# Patient Record
Sex: Female | Born: 1960
Health system: Southern US, Community
[De-identification: ages and names within clinical notes are randomized; demographics above are authoritative.]

## PROBLEM LIST (undated history)

## (undated) DIAGNOSIS — C801 Malignant (primary) neoplasm, unspecified: Secondary | ICD-10-CM

## (undated) DIAGNOSIS — C50919 Malignant neoplasm of unspecified site of unspecified female breast: Secondary | ICD-10-CM

## (undated) HISTORY — PX: BREAST LUMPECTOMY: SHX2

---

## 2004-07-09 ENCOUNTER — Ambulatory Visit: Payer: Self-pay

## 2004-08-07 ENCOUNTER — Ambulatory Visit: Payer: Self-pay

## 2005-02-01 ENCOUNTER — Ambulatory Visit: Payer: Self-pay

## 2005-09-10 ENCOUNTER — Ambulatory Visit: Payer: Self-pay | Admitting: Unknown Physician Specialty

## 2005-10-21 ENCOUNTER — Ambulatory Visit: Payer: Self-pay | Admitting: Obstetrics and Gynecology

## 2006-06-14 DIAGNOSIS — C50919 Malignant neoplasm of unspecified site of unspecified female breast: Secondary | ICD-10-CM

## 2006-06-14 DIAGNOSIS — C801 Malignant (primary) neoplasm, unspecified: Secondary | ICD-10-CM

## 2006-06-14 HISTORY — PX: BREAST BIOPSY: SHX20

## 2006-06-14 HISTORY — DX: Malignant neoplasm of unspecified site of unspecified female breast: C50.919

## 2006-06-14 HISTORY — DX: Malignant (primary) neoplasm, unspecified: C80.1

## 2006-11-08 ENCOUNTER — Ambulatory Visit: Payer: Self-pay | Admitting: Obstetrics and Gynecology

## 2006-11-17 ENCOUNTER — Ambulatory Visit: Payer: Self-pay | Admitting: General Surgery

## 2006-11-25 ENCOUNTER — Ambulatory Visit: Payer: Self-pay | Admitting: General Surgery

## 2006-11-29 ENCOUNTER — Ambulatory Visit: Payer: Self-pay | Admitting: General Surgery

## 2006-12-13 ENCOUNTER — Ambulatory Visit: Payer: Self-pay | Admitting: Internal Medicine

## 2006-12-22 ENCOUNTER — Ambulatory Visit: Payer: Self-pay | Admitting: Internal Medicine

## 2007-01-13 ENCOUNTER — Ambulatory Visit: Payer: Self-pay | Admitting: Internal Medicine

## 2007-02-13 ENCOUNTER — Ambulatory Visit: Payer: Self-pay | Admitting: Internal Medicine

## 2007-03-15 ENCOUNTER — Ambulatory Visit: Payer: Self-pay | Admitting: Internal Medicine

## 2007-04-15 ENCOUNTER — Ambulatory Visit: Payer: Self-pay | Admitting: Internal Medicine

## 2007-05-12 ENCOUNTER — Ambulatory Visit: Payer: Self-pay | Admitting: Obstetrics and Gynecology

## 2007-05-19 ENCOUNTER — Ambulatory Visit: Payer: Self-pay | Admitting: General Surgery

## 2007-06-15 ENCOUNTER — Ambulatory Visit: Payer: Self-pay | Admitting: Internal Medicine

## 2007-07-06 ENCOUNTER — Ambulatory Visit: Payer: Self-pay | Admitting: Internal Medicine

## 2007-07-16 ENCOUNTER — Ambulatory Visit: Payer: Self-pay | Admitting: Internal Medicine

## 2007-07-21 ENCOUNTER — Encounter: Admission: RE | Admit: 2007-07-21 | Discharge: 2007-07-21 | Payer: Self-pay | Admitting: Internal Medicine

## 2007-08-13 ENCOUNTER — Ambulatory Visit: Payer: Self-pay | Admitting: Internal Medicine

## 2007-08-31 ENCOUNTER — Ambulatory Visit: Payer: Self-pay | Admitting: Radiation Oncology

## 2007-09-13 ENCOUNTER — Ambulatory Visit: Payer: Self-pay | Admitting: Internal Medicine

## 2007-10-05 ENCOUNTER — Ambulatory Visit: Payer: Self-pay | Admitting: Internal Medicine

## 2007-10-13 ENCOUNTER — Ambulatory Visit: Payer: Self-pay | Admitting: Internal Medicine

## 2007-11-13 ENCOUNTER — Ambulatory Visit: Payer: Self-pay | Admitting: Internal Medicine

## 2007-11-13 ENCOUNTER — Ambulatory Visit: Payer: Self-pay | Admitting: General Surgery

## 2008-01-13 ENCOUNTER — Ambulatory Visit: Payer: Self-pay | Admitting: Internal Medicine

## 2008-02-02 ENCOUNTER — Ambulatory Visit: Payer: Self-pay | Admitting: Internal Medicine

## 2008-02-13 ENCOUNTER — Ambulatory Visit: Payer: Self-pay | Admitting: Internal Medicine

## 2008-02-13 ENCOUNTER — Ambulatory Visit: Payer: Self-pay | Admitting: Radiation Oncology

## 2008-03-14 ENCOUNTER — Ambulatory Visit: Payer: Self-pay | Admitting: Radiation Oncology

## 2008-07-15 ENCOUNTER — Ambulatory Visit: Payer: Self-pay | Admitting: Internal Medicine

## 2008-08-22 ENCOUNTER — Ambulatory Visit: Payer: Self-pay | Admitting: Internal Medicine

## 2008-09-02 ENCOUNTER — Encounter: Admission: RE | Admit: 2008-09-02 | Discharge: 2008-09-02 | Payer: Self-pay | Admitting: Internal Medicine

## 2008-09-12 ENCOUNTER — Ambulatory Visit: Payer: Self-pay | Admitting: Internal Medicine

## 2008-12-12 ENCOUNTER — Ambulatory Visit: Payer: Self-pay | Admitting: Internal Medicine

## 2008-12-26 ENCOUNTER — Ambulatory Visit: Payer: Self-pay | Admitting: Internal Medicine

## 2009-01-12 ENCOUNTER — Ambulatory Visit: Payer: Self-pay | Admitting: Internal Medicine

## 2009-06-10 ENCOUNTER — Ambulatory Visit: Payer: Self-pay | Admitting: Internal Medicine

## 2009-06-14 ENCOUNTER — Ambulatory Visit: Payer: Self-pay | Admitting: Internal Medicine

## 2009-07-03 ENCOUNTER — Ambulatory Visit: Payer: Self-pay | Admitting: Internal Medicine

## 2009-07-15 ENCOUNTER — Ambulatory Visit: Payer: Self-pay | Admitting: Internal Medicine

## 2009-09-12 ENCOUNTER — Ambulatory Visit: Payer: Self-pay | Admitting: Radiation Oncology

## 2009-09-25 ENCOUNTER — Ambulatory Visit: Payer: Self-pay | Admitting: Radiation Oncology

## 2009-09-25 ENCOUNTER — Ambulatory Visit: Payer: Self-pay | Admitting: Internal Medicine

## 2009-09-25 ENCOUNTER — Ambulatory Visit: Payer: Self-pay | Admitting: Obstetrics and Gynecology

## 2009-10-12 ENCOUNTER — Ambulatory Visit: Payer: Self-pay | Admitting: Radiation Oncology

## 2009-10-30 ENCOUNTER — Encounter: Admission: RE | Admit: 2009-10-30 | Discharge: 2009-10-30 | Payer: Self-pay | Admitting: Internal Medicine

## 2010-07-30 ENCOUNTER — Ambulatory Visit: Payer: Self-pay | Admitting: Internal Medicine

## 2010-07-31 LAB — CANCER ANTIGEN 27.29: CA 27.29: 23.4 U/mL (ref 0.0–38.6)

## 2010-08-13 ENCOUNTER — Ambulatory Visit: Payer: Self-pay | Admitting: Internal Medicine

## 2010-09-28 ENCOUNTER — Ambulatory Visit: Payer: Self-pay | Admitting: Internal Medicine

## 2011-01-25 ENCOUNTER — Ambulatory Visit: Payer: Self-pay | Admitting: Internal Medicine

## 2011-02-05 ENCOUNTER — Ambulatory Visit: Payer: Self-pay | Admitting: Unknown Physician Specialty

## 2011-02-13 ENCOUNTER — Ambulatory Visit: Payer: Self-pay | Admitting: Internal Medicine

## 2011-03-15 ENCOUNTER — Ambulatory Visit: Payer: Self-pay | Admitting: Internal Medicine

## 2011-04-15 ENCOUNTER — Ambulatory Visit: Payer: Self-pay | Admitting: Internal Medicine

## 2011-12-30 ENCOUNTER — Ambulatory Visit: Payer: Self-pay | Admitting: Obstetrics and Gynecology

## 2013-05-24 ENCOUNTER — Ambulatory Visit: Payer: Self-pay | Admitting: Obstetrics and Gynecology

## 2013-12-07 ENCOUNTER — Ambulatory Visit: Payer: Self-pay | Admitting: Obstetrics and Gynecology

## 2013-12-20 DIAGNOSIS — E039 Hypothyroidism, unspecified: Secondary | ICD-10-CM | POA: Insufficient documentation

## 2013-12-20 DIAGNOSIS — G4700 Insomnia, unspecified: Secondary | ICD-10-CM | POA: Insufficient documentation

## 2013-12-20 DIAGNOSIS — E1169 Type 2 diabetes mellitus with other specified complication: Secondary | ICD-10-CM | POA: Insufficient documentation

## 2015-02-13 DIAGNOSIS — R768 Other specified abnormal immunological findings in serum: Secondary | ICD-10-CM | POA: Insufficient documentation

## 2015-06-26 ENCOUNTER — Other Ambulatory Visit: Payer: Self-pay | Admitting: Obstetrics and Gynecology

## 2015-06-26 DIAGNOSIS — Z1231 Encounter for screening mammogram for malignant neoplasm of breast: Secondary | ICD-10-CM

## 2015-07-17 ENCOUNTER — Ambulatory Visit
Admission: RE | Admit: 2015-07-17 | Discharge: 2015-07-17 | Disposition: A | Discharge status: Home / Self Care | Payer: 59 | Source: Ambulatory Visit | Attending: Obstetrics and Gynecology | Admitting: Obstetrics and Gynecology

## 2015-07-17 DIAGNOSIS — Z1231 Encounter for screening mammogram for malignant neoplasm of breast: Secondary | ICD-10-CM | POA: Diagnosis not present

## 2015-07-17 HISTORY — DX: Malignant (primary) neoplasm, unspecified: C80.1

## 2015-07-17 HISTORY — DX: Malignant neoplasm of unspecified site of unspecified female breast: C50.919

## 2016-06-15 DIAGNOSIS — R7309 Other abnormal glucose: Secondary | ICD-10-CM | POA: Diagnosis not present

## 2016-06-15 DIAGNOSIS — E7800 Pure hypercholesterolemia, unspecified: Secondary | ICD-10-CM | POA: Diagnosis not present

## 2016-06-15 DIAGNOSIS — Z0000 Encounter for general adult medical examination without abnormal findings: Secondary | ICD-10-CM | POA: Diagnosis not present

## 2016-06-15 DIAGNOSIS — E559 Vitamin D deficiency, unspecified: Secondary | ICD-10-CM | POA: Diagnosis not present

## 2016-07-06 DIAGNOSIS — R05 Cough: Secondary | ICD-10-CM | POA: Diagnosis not present

## 2016-07-22 DIAGNOSIS — Z01419 Encounter for gynecological examination (general) (routine) without abnormal findings: Secondary | ICD-10-CM | POA: Diagnosis not present

## 2016-07-22 DIAGNOSIS — Z124 Encounter for screening for malignant neoplasm of cervix: Secondary | ICD-10-CM | POA: Diagnosis not present

## 2016-07-23 ENCOUNTER — Other Ambulatory Visit: Payer: Self-pay | Admitting: Obstetrics and Gynecology

## 2016-07-23 DIAGNOSIS — Z1231 Encounter for screening mammogram for malignant neoplasm of breast: Secondary | ICD-10-CM

## 2016-08-12 DIAGNOSIS — L738 Other specified follicular disorders: Secondary | ICD-10-CM | POA: Diagnosis not present

## 2016-08-30 ENCOUNTER — Ambulatory Visit
Admission: RE | Admit: 2016-08-30 | Discharge: 2016-08-30 | Disposition: A | Discharge status: Home / Self Care | Payer: 59 | Source: Ambulatory Visit | Attending: Obstetrics and Gynecology | Admitting: Obstetrics and Gynecology

## 2016-08-30 DIAGNOSIS — Z1231 Encounter for screening mammogram for malignant neoplasm of breast: Secondary | ICD-10-CM

## 2016-10-14 DIAGNOSIS — L738 Other specified follicular disorders: Secondary | ICD-10-CM | POA: Diagnosis not present

## 2016-11-18 DIAGNOSIS — L280 Lichen simplex chronicus: Secondary | ICD-10-CM | POA: Diagnosis not present

## 2016-11-18 DIAGNOSIS — L738 Other specified follicular disorders: Secondary | ICD-10-CM | POA: Diagnosis not present

## 2016-12-16 DIAGNOSIS — L280 Lichen simplex chronicus: Secondary | ICD-10-CM | POA: Diagnosis not present

## 2016-12-16 DIAGNOSIS — L738 Other specified follicular disorders: Secondary | ICD-10-CM | POA: Diagnosis not present

## 2017-02-01 DIAGNOSIS — I152 Hypertension secondary to endocrine disorders: Secondary | ICD-10-CM | POA: Insufficient documentation

## 2017-02-01 DIAGNOSIS — E039 Hypothyroidism, unspecified: Secondary | ICD-10-CM | POA: Diagnosis not present

## 2017-02-01 DIAGNOSIS — Z79899 Other long term (current) drug therapy: Secondary | ICD-10-CM | POA: Diagnosis not present

## 2017-02-01 DIAGNOSIS — E119 Type 2 diabetes mellitus without complications: Secondary | ICD-10-CM | POA: Insufficient documentation

## 2017-02-01 DIAGNOSIS — R7302 Impaired glucose tolerance (oral): Secondary | ICD-10-CM | POA: Diagnosis not present

## 2017-02-01 DIAGNOSIS — E559 Vitamin D deficiency, unspecified: Secondary | ICD-10-CM | POA: Diagnosis not present

## 2017-02-01 DIAGNOSIS — I10 Essential (primary) hypertension: Secondary | ICD-10-CM | POA: Diagnosis not present

## 2017-02-10 DIAGNOSIS — L738 Other specified follicular disorders: Secondary | ICD-10-CM | POA: Diagnosis not present

## 2017-02-10 DIAGNOSIS — L280 Lichen simplex chronicus: Secondary | ICD-10-CM | POA: Diagnosis not present

## 2017-03-21 DIAGNOSIS — L905 Scar conditions and fibrosis of skin: Secondary | ICD-10-CM | POA: Diagnosis not present

## 2017-03-21 DIAGNOSIS — L280 Lichen simplex chronicus: Secondary | ICD-10-CM | POA: Diagnosis not present

## 2017-04-26 DIAGNOSIS — I10 Essential (primary) hypertension: Secondary | ICD-10-CM | POA: Diagnosis not present

## 2017-07-28 DIAGNOSIS — Z124 Encounter for screening for malignant neoplasm of cervix: Secondary | ICD-10-CM | POA: Diagnosis not present

## 2017-07-28 DIAGNOSIS — Z01419 Encounter for gynecological examination (general) (routine) without abnormal findings: Secondary | ICD-10-CM | POA: Diagnosis not present

## 2017-07-28 DIAGNOSIS — R635 Abnormal weight gain: Secondary | ICD-10-CM | POA: Diagnosis not present

## 2017-08-02 ENCOUNTER — Other Ambulatory Visit: Payer: Self-pay | Admitting: Obstetrics and Gynecology

## 2017-08-02 DIAGNOSIS — Z1231 Encounter for screening mammogram for malignant neoplasm of breast: Secondary | ICD-10-CM

## 2017-08-02 DIAGNOSIS — Z13820 Encounter for screening for osteoporosis: Secondary | ICD-10-CM

## 2017-08-25 ENCOUNTER — Ambulatory Visit
Admission: RE | Admit: 2017-08-25 | Discharge: 2017-08-25 | Disposition: A | Discharge status: Home / Self Care | Payer: 59 | Source: Ambulatory Visit | Attending: Internal Medicine | Admitting: Internal Medicine

## 2017-08-25 ENCOUNTER — Other Ambulatory Visit: Payer: Self-pay | Admitting: Internal Medicine

## 2017-08-25 DIAGNOSIS — E7800 Pure hypercholesterolemia, unspecified: Secondary | ICD-10-CM | POA: Diagnosis not present

## 2017-08-25 DIAGNOSIS — D869 Sarcoidosis, unspecified: Secondary | ICD-10-CM | POA: Diagnosis not present

## 2017-08-25 DIAGNOSIS — I517 Cardiomegaly: Secondary | ICD-10-CM | POA: Diagnosis not present

## 2017-08-25 DIAGNOSIS — R7302 Impaired glucose tolerance (oral): Secondary | ICD-10-CM | POA: Diagnosis not present

## 2017-08-25 DIAGNOSIS — R059 Cough, unspecified: Secondary | ICD-10-CM

## 2017-08-25 DIAGNOSIS — R05 Cough: Secondary | ICD-10-CM

## 2017-08-25 DIAGNOSIS — Z0000 Encounter for general adult medical examination without abnormal findings: Secondary | ICD-10-CM | POA: Diagnosis not present

## 2017-08-25 DIAGNOSIS — E559 Vitamin D deficiency, unspecified: Secondary | ICD-10-CM | POA: Diagnosis not present

## 2017-09-15 ENCOUNTER — Ambulatory Visit
Admission: RE | Admit: 2017-09-15 | Discharge: 2017-09-15 | Disposition: A | Discharge status: Home / Self Care | Payer: 59 | Source: Ambulatory Visit | Attending: Obstetrics and Gynecology | Admitting: Obstetrics and Gynecology

## 2017-09-15 DIAGNOSIS — Z1231 Encounter for screening mammogram for malignant neoplasm of breast: Secondary | ICD-10-CM | POA: Diagnosis not present

## 2017-09-15 DIAGNOSIS — Z780 Asymptomatic menopausal state: Secondary | ICD-10-CM | POA: Diagnosis not present

## 2017-09-15 DIAGNOSIS — M85822 Other specified disorders of bone density and structure, left upper arm: Secondary | ICD-10-CM | POA: Diagnosis not present

## 2017-09-15 DIAGNOSIS — Z13820 Encounter for screening for osteoporosis: Secondary | ICD-10-CM

## 2017-09-15 DIAGNOSIS — L905 Scar conditions and fibrosis of skin: Secondary | ICD-10-CM | POA: Diagnosis not present

## 2017-09-22 DIAGNOSIS — I8312 Varicose veins of left lower extremity with inflammation: Secondary | ICD-10-CM | POA: Diagnosis not present

## 2017-09-22 DIAGNOSIS — I83813 Varicose veins of bilateral lower extremities with pain: Secondary | ICD-10-CM | POA: Diagnosis not present

## 2017-10-10 DIAGNOSIS — I83813 Varicose veins of bilateral lower extremities with pain: Secondary | ICD-10-CM | POA: Diagnosis not present

## 2017-11-10 DIAGNOSIS — I8312 Varicose veins of left lower extremity with inflammation: Secondary | ICD-10-CM | POA: Diagnosis not present

## 2017-11-10 DIAGNOSIS — I83812 Varicose veins of left lower extremities with pain: Secondary | ICD-10-CM | POA: Diagnosis not present

## 2017-12-22 DIAGNOSIS — I83812 Varicose veins of left lower extremities with pain: Secondary | ICD-10-CM | POA: Diagnosis not present

## 2017-12-22 DIAGNOSIS — R21 Rash and other nonspecific skin eruption: Secondary | ICD-10-CM | POA: Diagnosis not present

## 2017-12-22 DIAGNOSIS — I8312 Varicose veins of left lower extremity with inflammation: Secondary | ICD-10-CM | POA: Diagnosis not present

## 2017-12-22 DIAGNOSIS — L738 Other specified follicular disorders: Secondary | ICD-10-CM | POA: Diagnosis not present

## 2017-12-26 DIAGNOSIS — I8312 Varicose veins of left lower extremity with inflammation: Secondary | ICD-10-CM | POA: Diagnosis not present

## 2017-12-28 DIAGNOSIS — E039 Hypothyroidism, unspecified: Secondary | ICD-10-CM | POA: Diagnosis not present

## 2017-12-28 DIAGNOSIS — N951 Menopausal and female climacteric states: Secondary | ICD-10-CM | POA: Diagnosis not present

## 2017-12-28 DIAGNOSIS — Z79890 Hormone replacement therapy (postmenopausal): Secondary | ICD-10-CM | POA: Diagnosis not present

## 2018-01-19 DIAGNOSIS — I83892 Varicose veins of left lower extremities with other complications: Secondary | ICD-10-CM | POA: Diagnosis not present

## 2018-01-19 DIAGNOSIS — I8312 Varicose veins of left lower extremity with inflammation: Secondary | ICD-10-CM | POA: Diagnosis not present

## 2018-01-26 DIAGNOSIS — M810 Age-related osteoporosis without current pathological fracture: Secondary | ICD-10-CM | POA: Diagnosis not present

## 2018-01-26 DIAGNOSIS — Z79890 Hormone replacement therapy (postmenopausal): Secondary | ICD-10-CM | POA: Diagnosis not present

## 2018-01-26 DIAGNOSIS — N951 Menopausal and female climacteric states: Secondary | ICD-10-CM | POA: Diagnosis not present

## 2018-02-02 DIAGNOSIS — S99922A Unspecified injury of left foot, initial encounter: Secondary | ICD-10-CM | POA: Diagnosis not present

## 2018-02-02 DIAGNOSIS — I8312 Varicose veins of left lower extremity with inflammation: Secondary | ICD-10-CM | POA: Diagnosis not present

## 2018-02-02 DIAGNOSIS — M7981 Nontraumatic hematoma of soft tissue: Secondary | ICD-10-CM | POA: Diagnosis not present

## 2018-02-02 DIAGNOSIS — I83812 Varicose veins of left lower extremities with pain: Secondary | ICD-10-CM | POA: Diagnosis not present

## 2018-02-16 DIAGNOSIS — I8312 Varicose veins of left lower extremity with inflammation: Secondary | ICD-10-CM | POA: Diagnosis not present

## 2018-02-16 DIAGNOSIS — M7981 Nontraumatic hematoma of soft tissue: Secondary | ICD-10-CM | POA: Diagnosis not present

## 2018-02-17 DIAGNOSIS — J069 Acute upper respiratory infection, unspecified: Secondary | ICD-10-CM | POA: Diagnosis not present

## 2018-02-17 DIAGNOSIS — J019 Acute sinusitis, unspecified: Secondary | ICD-10-CM | POA: Diagnosis not present

## 2018-03-13 DIAGNOSIS — N951 Menopausal and female climacteric states: Secondary | ICD-10-CM | POA: Diagnosis not present

## 2018-03-13 DIAGNOSIS — E039 Hypothyroidism, unspecified: Secondary | ICD-10-CM | POA: Diagnosis not present

## 2018-03-13 DIAGNOSIS — E559 Vitamin D deficiency, unspecified: Secondary | ICD-10-CM | POA: Diagnosis not present

## 2018-03-13 DIAGNOSIS — D869 Sarcoidosis, unspecified: Secondary | ICD-10-CM | POA: Diagnosis not present

## 2018-03-13 DIAGNOSIS — R739 Hyperglycemia, unspecified: Secondary | ICD-10-CM | POA: Diagnosis not present

## 2018-03-13 DIAGNOSIS — E782 Mixed hyperlipidemia: Secondary | ICD-10-CM | POA: Diagnosis not present

## 2018-03-13 DIAGNOSIS — Z79890 Hormone replacement therapy (postmenopausal): Secondary | ICD-10-CM | POA: Diagnosis not present

## 2018-03-13 DIAGNOSIS — D649 Anemia, unspecified: Secondary | ICD-10-CM | POA: Diagnosis not present

## 2018-03-14 DIAGNOSIS — I8312 Varicose veins of left lower extremity with inflammation: Secondary | ICD-10-CM | POA: Diagnosis not present

## 2018-03-14 DIAGNOSIS — M7981 Nontraumatic hematoma of soft tissue: Secondary | ICD-10-CM | POA: Diagnosis not present

## 2018-03-21 DIAGNOSIS — H109 Unspecified conjunctivitis: Secondary | ICD-10-CM | POA: Diagnosis not present

## 2018-03-28 DIAGNOSIS — I8312 Varicose veins of left lower extremity with inflammation: Secondary | ICD-10-CM | POA: Diagnosis not present

## 2018-04-03 DIAGNOSIS — H15122 Nodular episcleritis, left eye: Secondary | ICD-10-CM | POA: Diagnosis not present

## 2018-04-10 DIAGNOSIS — H15122 Nodular episcleritis, left eye: Secondary | ICD-10-CM | POA: Diagnosis not present

## 2018-04-10 DIAGNOSIS — D8689 Sarcoidosis of other sites: Secondary | ICD-10-CM | POA: Diagnosis not present

## 2018-04-17 DIAGNOSIS — I8312 Varicose veins of left lower extremity with inflammation: Secondary | ICD-10-CM | POA: Diagnosis not present

## 2018-04-20 DIAGNOSIS — L738 Other specified follicular disorders: Secondary | ICD-10-CM | POA: Diagnosis not present

## 2018-05-23 DIAGNOSIS — I8312 Varicose veins of left lower extremity with inflammation: Secondary | ICD-10-CM | POA: Diagnosis not present

## 2018-06-22 DIAGNOSIS — Z79890 Hormone replacement therapy (postmenopausal): Secondary | ICD-10-CM | POA: Diagnosis not present

## 2018-06-22 DIAGNOSIS — M810 Age-related osteoporosis without current pathological fracture: Secondary | ICD-10-CM | POA: Diagnosis not present

## 2018-06-22 DIAGNOSIS — E039 Hypothyroidism, unspecified: Secondary | ICD-10-CM | POA: Diagnosis not present

## 2018-08-15 ENCOUNTER — Other Ambulatory Visit: Payer: Self-pay | Admitting: Internal Medicine

## 2018-08-15 DIAGNOSIS — Z1231 Encounter for screening mammogram for malignant neoplasm of breast: Secondary | ICD-10-CM

## 2018-09-01 DIAGNOSIS — L280 Lichen simplex chronicus: Secondary | ICD-10-CM | POA: Diagnosis not present

## 2018-09-01 DIAGNOSIS — L738 Other specified follicular disorders: Secondary | ICD-10-CM | POA: Diagnosis not present

## 2018-09-06 DIAGNOSIS — R5383 Other fatigue: Secondary | ICD-10-CM | POA: Diagnosis not present

## 2018-09-06 DIAGNOSIS — E039 Hypothyroidism, unspecified: Secondary | ICD-10-CM | POA: Diagnosis not present

## 2018-09-06 DIAGNOSIS — N951 Menopausal and female climacteric states: Secondary | ICD-10-CM | POA: Diagnosis not present

## 2018-09-06 DIAGNOSIS — Z79890 Hormone replacement therapy (postmenopausal): Secondary | ICD-10-CM | POA: Diagnosis not present

## 2019-01-08 ENCOUNTER — Ambulatory Visit
Admission: RE | Admit: 2019-01-08 | Discharge: 2019-01-08 | Disposition: A | Discharge status: Home / Self Care | Payer: 59 | Source: Ambulatory Visit | Attending: Internal Medicine | Admitting: Internal Medicine

## 2019-01-08 DIAGNOSIS — Z1231 Encounter for screening mammogram for malignant neoplasm of breast: Secondary | ICD-10-CM | POA: Diagnosis present

## 2019-03-08 DIAGNOSIS — D863 Sarcoidosis of skin: Secondary | ICD-10-CM

## 2019-03-08 HISTORY — DX: Sarcoidosis of skin: D86.3

## 2019-09-27 ENCOUNTER — Encounter: Payer: Self-pay | Admitting: Dermatology

## 2019-09-27 ENCOUNTER — Ambulatory Visit: Payer: 59 | Admitting: Dermatology

## 2019-09-27 ENCOUNTER — Other Ambulatory Visit: Payer: Self-pay

## 2019-09-27 DIAGNOSIS — D485 Neoplasm of uncertain behavior of skin: Secondary | ICD-10-CM | POA: Diagnosis not present

## 2019-09-27 NOTE — Progress Notes (Signed)
Follow-Up Visit   Subjective  Natalie Higgins is a 59 y.o. female who presents for the following: Follow-up. Patient has a history, on and off, for 5 years of sores, itching, and hairloss of the scalp. This has worsened over the past 3 weeks. Currently she is using cortisone cream, Neosporin, Betadine, and Aloe Vera oil. She has used multiple topical steroids and had IL steroid injections in the past without any improvement. Patient has a history of sarcoidosis that is still active on the skin;, biopsy proven in September 2020, of the back.  In the past, her scalp condition was not nearly as bad, and was felt possibly due to sarcoidosis or psoriasis.  Previous treatments were helpful, but apparently this has progressed more recently.  The following portions of the chart were reviewed this encounter and updated as appropriate: Allergies  Meds  Problems  Med Hx  Surg Hx  Fam Hx      Review of Systems: No other skin or systemic complaints.  Objective  Well appearing patient in no apparent distress; mood and affect are within normal limits.  A focused examination was performed including face, scalp. Relevant physical exam findings are noted in the Assessment and Plan.  Objective  L Posterior scalp lateral: Large scalp plaques with alopecia of post and sides of scalp.  Objective  Left Posterior Scalp Medial: Large scalp plaques with alopecia of post and sides of scalp.         Assessment & Plan  Neoplasm of uncertain behavior of skin (2) L Posterior scalp lateral  Skin / nail biopsy Type of biopsy: punch   Informed consent: discussed and consent obtained   Timeout: patient name, date of birth, surgical site, and procedure verified   Procedure prep:  Patient was prepped and draped in usual sterile fashion (the patient was cleaned and prepped) Prep type:  Isopropyl alcohol Anesthesia: the lesion was anesthetized in a standard fashion   Anesthetic:  1% lidocaine w/  epinephrine 1-100,000 buffered w/ 8.4% NaHCO3 Punch size:  3.5 mm Suture size:  4-0 Suture type: nylon   Hemostasis achieved with: suture, pressure and aluminum chloride   Outcome: patient tolerated procedure well   Post-procedure details: sterile dressing applied and wound care instructions given   Dressing type: bandage, petrolatum and pressure dressing    Specimen 1 - Surgical pathology Differential Diagnosis: Psoriasis vs Sarcoidosis vs Tinea vs Other Alopecia Check Margins: No Objective: Large scalp plaques with alopecia of post and sides of scalp.  Left Posterior Scalp Medial  Skin / nail biopsy Type of biopsy: punch   Informed consent: discussed and consent obtained   Timeout: patient name, date of birth, surgical site, and procedure verified   Procedure prep:  Patient was prepped and draped in usual sterile fashion (the patient was cleaned and prepped) Prep type:  Isopropyl alcohol Anesthesia: the lesion was anesthetized in a standard fashion   Anesthetic:  1% lidocaine w/ epinephrine 1-100,000 buffered w/ 8.4% NaHCO3 Punch size:  3.5 mm Suture size:  4-0 Suture type: nylon   Hemostasis achieved with: suture, pressure and aluminum chloride   Outcome: patient tolerated procedure well   Post-procedure details: sterile dressing applied and wound care instructions given   Dressing type: bandage, petrolatum and pressure dressing    Specimen 2 - Surgical pathology Differential Diagnosis: Psoriasis vs Sarcoidosis vs Tinea vs Other Alopecia Check Margins: No Objective: Large scalp plaques with alopecia of post and sides of scalp.   Will discuss biopsy results  and treatment options on follow-up.  Return in about 1 week (around 10/04/2019) for Suture removal, biopsy f/u.Lindi Adie, CMA, am acting as scribe for Sarina Ser, MD . Documentation: I have reviewed the above documentation for accuracy and completeness, and I agree with the above.  Sarina Ser,  MD

## 2019-09-27 NOTE — Patient Instructions (Signed)

## 2019-10-04 ENCOUNTER — Other Ambulatory Visit: Payer: Self-pay

## 2019-10-04 ENCOUNTER — Ambulatory Visit: Payer: 59 | Admitting: Dermatology

## 2019-10-04 DIAGNOSIS — D863 Sarcoidosis of skin: Secondary | ICD-10-CM

## 2019-10-04 DIAGNOSIS — Z8709 Personal history of other diseases of the respiratory system: Secondary | ICD-10-CM

## 2019-10-04 DIAGNOSIS — D869 Sarcoidosis, unspecified: Secondary | ICD-10-CM

## 2019-10-04 NOTE — Progress Notes (Signed)
   Follow-Up Visit   Subjective  Natalie Higgins is a 59 y.o. female who presents for the following: suture removal and discuss pathology results/treatment (patient states that suture sites have been itchy but otherwise healing ok). Patient c/o pain, itching, and hair loss that is significantly affecting her life.  The following portions of the chart were reviewed this encounter and updated as appropriate: Allergies  Meds  Problems  Med Hx  Surg Hx  Fam Hx     Review of Systems: No other skin or systemic complaints.  Objective  Well appearing patient in no apparent distress; mood and affect are within normal limits.  A focused examination was performed including the scalp. Relevant physical exam findings are noted in the Assessment and Plan.  Objective  Scalp: Healing biopsy site   Assessment & Plan  Sarcoid Scalp  Biopsy proven Sarcoidosis of skin of scalp (with a past history of lung involvement). Symptoms include pain, itching, and hair loss of the scalp - sutures removed and Vaseline applied to aa's. Discussed pathology results and treatment options. Consider Plaquenil 200mg  orally; discussed possible side effects of eye changes. Patient recently evaluated by her ophthalmologist in March. No pulmonary issues per patient - she is being followed by her PCP with annual chest x-rays. She does have hx of lung involvement of sarcoidosis, but has not had issues thus far other than her scalp symptoms.  Start Clobetasol sol. (patient has) QD x 2 wks. Then decrease to QD 5d/wk.  Will review other treatment options other than Topical Steroids and po Plaquenil prior to initiating treatment.  Return in about 3 months (around 01/03/2020).   Luther Redo, CMA, am acting as scribe for Sarina Ser, MD .  Documentation: I have reviewed the above documentation for accuracy and completeness, and I agree with the above.  Sarina Ser, MD

## 2019-10-09 ENCOUNTER — Encounter: Payer: Self-pay | Admitting: Dermatology

## 2019-10-09 ENCOUNTER — Telehealth: Payer: Self-pay | Admitting: Dermatology

## 2019-10-09 NOTE — Telephone Encounter (Signed)
Called pt to discuss treatment options for biopsy proven Sarcoidosis of the skin involving scalp causing symptoms of severe itch and hair loss. I left voicemail advising I researched treatment and as discussed at recent visit, I feel starting Plaquenil 200 mg 1 po bid would be the first thing to consider and start. Other options would be Humira and methotrexate (would not recommend Methotrexate due to side effects). Humira would be good option if plaquenil not helpful. I will send Plaquenil 200 mg 1 po bid #60 with 3 rf in . We will order CBC with diff now and repeat in 6 weeks. Will send note to eye doctor at [facility] to see if any other studies need done as baseline with plaquenil tx.  May need to set up 6-12 mo follow up with eye doctor (whatever they recommend)  Please: 1- send in Plaquenil Rx as above 2- order CBC as noted above. 3- Send note to [facility] eye doctor (get name from patient) 4- Make fu appt for pt in 3 mos

## 2019-10-10 ENCOUNTER — Telehealth: Payer: Self-pay

## 2019-10-10 ENCOUNTER — Other Ambulatory Visit: Payer: Self-pay

## 2019-10-10 DIAGNOSIS — D869 Sarcoidosis, unspecified: Secondary | ICD-10-CM

## 2019-10-10 MED ORDER — HYDROXYCHLOROQUINE SULFATE 200 MG PO TABS: 200.0000 mg | ORAL_TABLET | Freq: Two times a day (BID) | ORAL | 3 refills | Status: DC

## 2019-10-10 NOTE — Telephone Encounter (Signed)
Left voicemail please call here to discuss biopsy results

## 2019-10-10 NOTE — Telephone Encounter (Signed)
Discussed biopsy results with pt, she report she did get Dr Marthann Schiller message and she understand and did not have any questions, she will start Plaquenil 200 mg 1 tablet po bid  Pt will stop by the office tomorrow to pick up a lab order and she will bring the name of her eye doctor to this office tomorrow, pt did not know the doctors name.   Pt will schedule a 3 months f/u when she stop by here tomorrow.

## 2019-10-11 ENCOUNTER — Other Ambulatory Visit: Payer: Self-pay | Admitting: Dermatology

## 2019-10-12 LAB — CBC WITH DIFFERENTIAL/PLATELET
Basophils Absolute: 0.1 10*3/uL (ref 0.0–0.2)
Basos: 1 %
EOS (ABSOLUTE): 0.1 10*3/uL (ref 0.0–0.4)
Eos: 1 %
Hematocrit: 42.5 % (ref 34.0–46.6)
Hemoglobin: 14.3 g/dL (ref 11.1–15.9)
Immature Grans (Abs): 0 10*3/uL (ref 0.0–0.1)
Immature Granulocytes: 0 %
Lymphocytes Absolute: 2.5 10*3/uL (ref 0.7–3.1)
Lymphs: 33 %
MCH: 28.8 pg (ref 26.6–33.0)
MCHC: 33.6 g/dL (ref 31.5–35.7)
MCV: 86 fL (ref 79–97)
Monocytes Absolute: 0.5 10*3/uL (ref 0.1–0.9)
Monocytes: 7 %
Neutrophils Absolute: 4.4 10*3/uL (ref 1.4–7.0)
Neutrophils: 58 %
Platelets: 301 10*3/uL (ref 150–450)
RBC: 4.97 x10E6/uL (ref 3.77–5.28)
RDW: 13 % (ref 11.7–15.4)
WBC: 7.6 10*3/uL (ref 3.4–10.8)

## 2019-10-17 ENCOUNTER — Other Ambulatory Visit: Payer: Self-pay

## 2019-10-17 DIAGNOSIS — D869 Sarcoidosis, unspecified: Secondary | ICD-10-CM

## 2019-10-18 ENCOUNTER — Telehealth: Payer: Self-pay

## 2019-10-18 NOTE — Telephone Encounter (Signed)
LM on VM to return my call. 

## 2019-10-18 NOTE — Telephone Encounter (Signed)
Discussed lab results with pt, start Plaquenil tablets

## 2019-11-09 ENCOUNTER — Ambulatory Visit: Payer: 59 | Admitting: Dermatology

## 2019-12-26 ENCOUNTER — Other Ambulatory Visit: Payer: Self-pay | Admitting: Obstetrics and Gynecology

## 2019-12-26 DIAGNOSIS — Z1231 Encounter for screening mammogram for malignant neoplasm of breast: Secondary | ICD-10-CM

## 2020-01-07 ENCOUNTER — Ambulatory Visit: Payer: 59 | Admitting: Dermatology

## 2020-01-07 ENCOUNTER — Other Ambulatory Visit: Payer: Self-pay

## 2020-01-07 ENCOUNTER — Encounter: Payer: Self-pay | Admitting: Dermatology

## 2020-01-07 DIAGNOSIS — D869 Sarcoidosis, unspecified: Secondary | ICD-10-CM | POA: Diagnosis not present

## 2020-01-07 DIAGNOSIS — L659 Nonscarring hair loss, unspecified: Secondary | ICD-10-CM

## 2020-01-07 NOTE — Progress Notes (Signed)
   Follow-Up Visit   Subjective  Natalie Higgins is a 59 y.o. female who presents for the following: Follow-up (Sarcoidosis of scalp and face). The patient has had lung involvement in past, but no longer has signs or symptoms of lung involvement.  Patient presents today for follow up on OV 10/04/19 for Sarcoidosis of scalp and face with associated scalp alopecia, patient currently using Plaquenil 200 mg 1 po bid and clobetasol sol when lesions are present. Patient states that lesions are getting better. Patient states she is having no side effects from Plaquenil. Patient has seen Ophthalmology in March but has not returned since dx of sarcoidosis  The following portions of the chart were reviewed this encounter and updated as appropriate:  Allergies  Meds  Problems  Med Hx  Surg Hx  Fam Hx     Review of Systems:  No other skin or systemic complaints except as noted in HPI or Assessment and Plan.  Objective  Well appearing patient in no apparent distress; mood and affect are within normal limits.  A focused examination was performed including Scalp. Relevant physical exam findings are noted in the Assessment and Plan.  Objective  Mid Frontal Scalp: Scaly plaque posterior and left  scalp  Images         Assessment & Plan    Sarcoidosis of skin of face and scalp with scalp alopecia Improving but persistent - hair re-growing and shrinking plaques of face and scalp.  Pt is pleased with improvement.  No side effects from Plaquenil. Scalp and face No lung symptoms (but sarcoid lung involvement in past).   Annual CXR by PCP have been clear.   Recommend Ophthalmology appt by three months for Plaquenil Eye exam.  Continue Plaquenil 200 mg 1 po bid Continue Clobetasol sol to active areas.  Will order CBC with Diff and Comprehensive panel today.  CBC with Differential/Platelet - Mid Frontal Scalp  CMP - Mid Frontal Scalp  Return in about 3 months (around 04/08/2020) for  Sarcoidosis.  Marene Lenz, CMA, am acting as scribe for Sarina Ser, MD . Documentation: I have reviewed the above documentation for accuracy and completeness, and I agree with the above.  Sarina Ser, MD

## 2020-01-07 NOTE — Patient Instructions (Signed)
Recommend daily broad spectrum sunscreen SPF 30+ to sun-exposed areas, reapply every 2 hours as needed. Call for new or changing lesions.  

## 2020-01-08 ENCOUNTER — Encounter: Payer: Self-pay | Admitting: Dermatology

## 2020-01-18 ENCOUNTER — Ambulatory Visit
Admission: RE | Admit: 2020-01-18 | Discharge: 2020-01-18 | Disposition: A | Discharge status: Home / Self Care | Payer: 59 | Source: Ambulatory Visit | Attending: Obstetrics and Gynecology | Admitting: Obstetrics and Gynecology

## 2020-01-18 ENCOUNTER — Other Ambulatory Visit: Payer: Self-pay

## 2020-01-18 DIAGNOSIS — Z1231 Encounter for screening mammogram for malignant neoplasm of breast: Secondary | ICD-10-CM | POA: Insufficient documentation

## 2020-01-18 LAB — COMPREHENSIVE METABOLIC PANEL
ALT: 23 IU/L (ref 0–32)
AST: 25 IU/L (ref 0–40)
Albumin/Globulin Ratio: 1.6 (ref 1.2–2.2)
Albumin: 4.1 g/dL (ref 3.8–4.9)
Alkaline Phosphatase: 61 IU/L (ref 48–121)
BUN/Creatinine Ratio: 16 (ref 9–23)
BUN: 15 mg/dL (ref 6–24)
Bilirubin Total: <0.2 mg/dL (ref 0.0–1.2)
CO2: 26 mmol/L (ref 20–29)
Calcium: 9.7 mg/dL (ref 8.7–10.2)
Chloride: 105 mmol/L (ref 96–106)
Creatinine, Ser: 0.93 mg/dL (ref 0.57–1.00)
GFR calc Af Amer: 78 mL/min/{1.73_m2} (ref >59)
GFR calc non Af Amer: 68 mL/min/{1.73_m2} (ref >59)
Globulin, Total: 2.5 g/dL (ref 1.5–4.5)
Glucose: 93 mg/dL (ref 65–99)
Potassium: 5 mmol/L (ref 3.5–5.2)
Sodium: 142 mmol/L (ref 134–144)
Total Protein: 6.6 g/dL (ref 6.0–8.5)

## 2020-01-18 LAB — CBC WITH DIFFERENTIAL/PLATELET
Basophils Absolute: 0.1 10*3/uL (ref 0.0–0.2)
Basos: 1 %
EOS (ABSOLUTE): 0.1 10*3/uL (ref 0.0–0.4)
Eos: 2 %
Hematocrit: 42.6 % (ref 34.0–46.6)
Hemoglobin: 14.2 g/dL (ref 11.1–15.9)
Immature Grans (Abs): 0 10*3/uL (ref 0.0–0.1)
Immature Granulocytes: 0 %
Lymphocytes Absolute: 2.9 10*3/uL (ref 0.7–3.1)
Lymphs: 37 %
MCH: 29 pg (ref 26.6–33.0)
MCHC: 33.3 g/dL (ref 31.5–35.7)
MCV: 87 fL (ref 79–97)
Monocytes Absolute: 0.5 10*3/uL (ref 0.1–0.9)
Monocytes: 7 %
Neutrophils Absolute: 4.3 10*3/uL (ref 1.4–7.0)
Neutrophils: 53 %
Platelets: 299 10*3/uL (ref 150–450)
RBC: 4.89 x10E6/uL (ref 3.77–5.28)
RDW: 13.4 % (ref 11.7–15.4)
WBC: 8 10*3/uL (ref 3.4–10.8)

## 2020-01-21 ENCOUNTER — Telehealth: Payer: Self-pay

## 2020-01-21 NOTE — Telephone Encounter (Signed)
-----   Message from Ralene Bathe, MD sent at 01/19/2020  1:05 PM EDT ----- Blood Counts and Chemistries = All NORMAL Continue Hydroxychloroquine for Sarcoidosis Keep fu appt and eye doctor appt.

## 2020-01-21 NOTE — Telephone Encounter (Signed)
Patient informed of lab results - no refills needed at this time per patient.

## 2020-02-05 ENCOUNTER — Other Ambulatory Visit: Payer: Self-pay | Admitting: Dermatology

## 2020-02-17 IMAGING — CR DG CHEST 2V
2 series · 2 of 2 positions shown · non-contrast
Comparison: Chest x-ray of 09/25/2009

CLINICAL DATA: History of sarcoidosis, cough for 3 days, former
smoking history

EXAM:
CHEST - 2 VIEW

[chest pa]
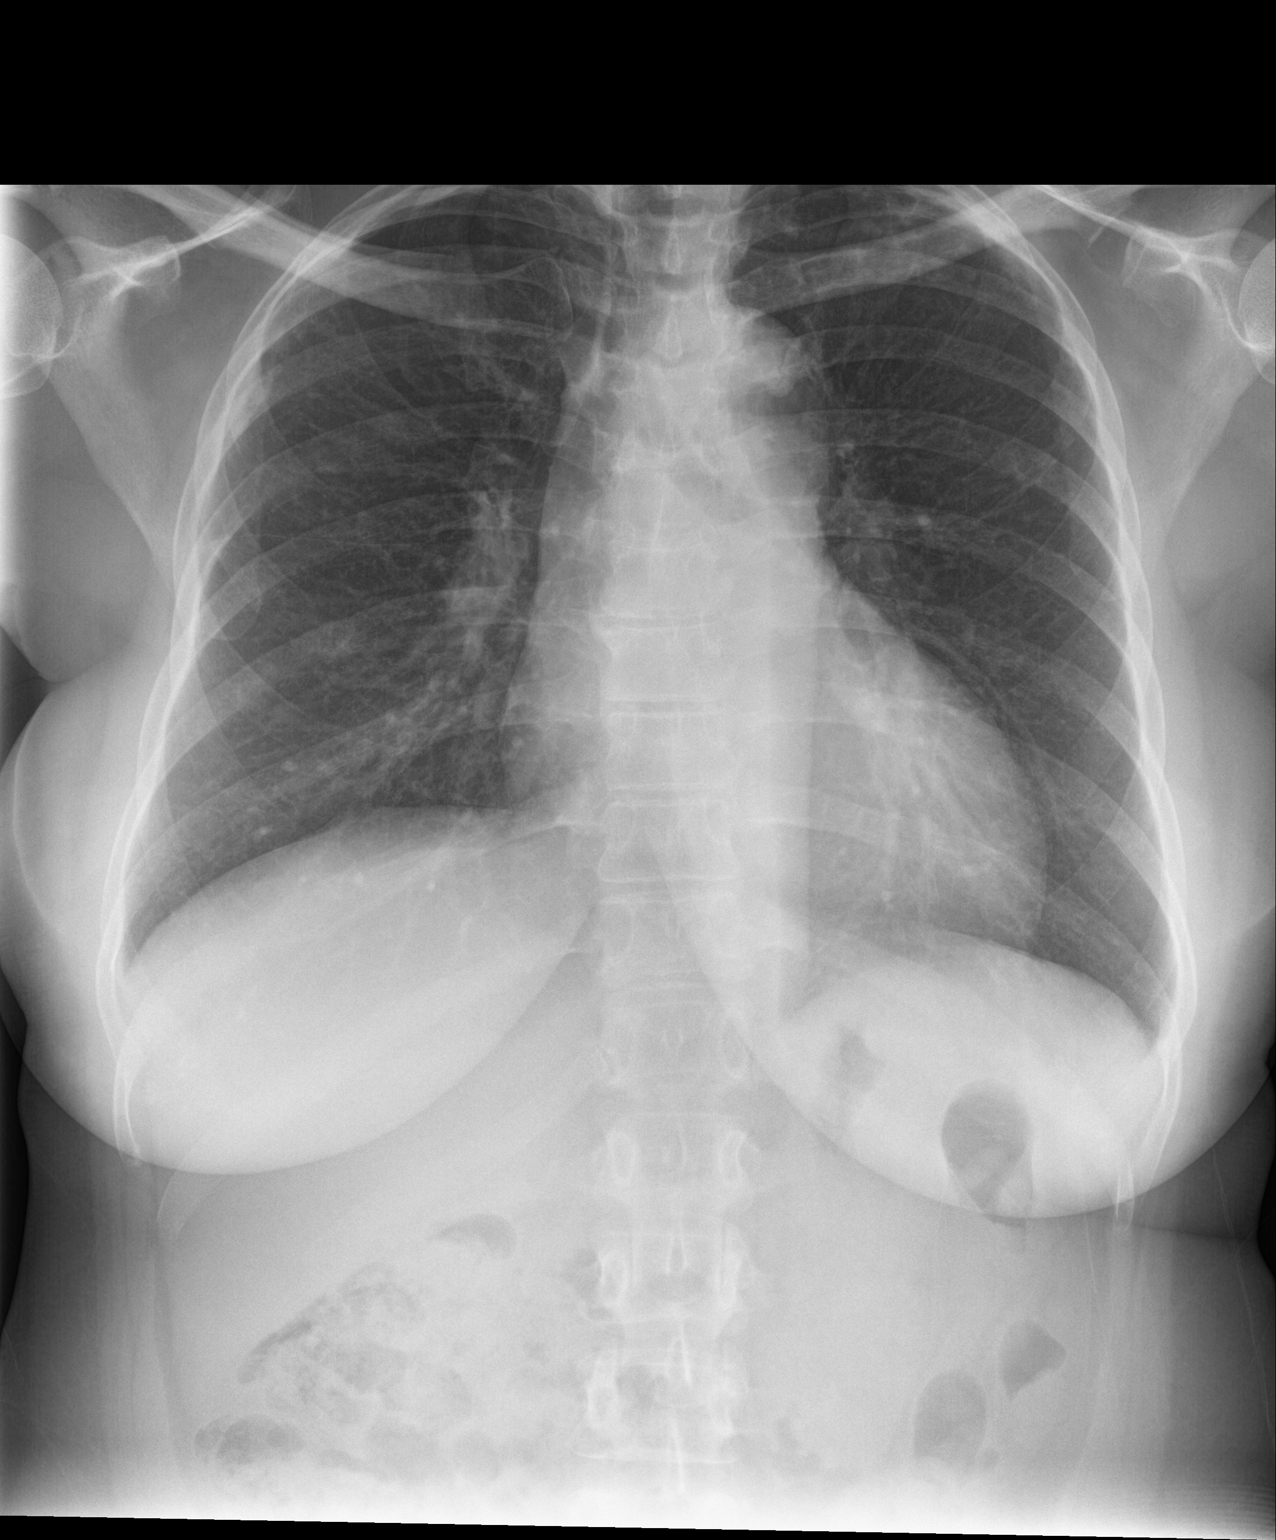

[chest lat]
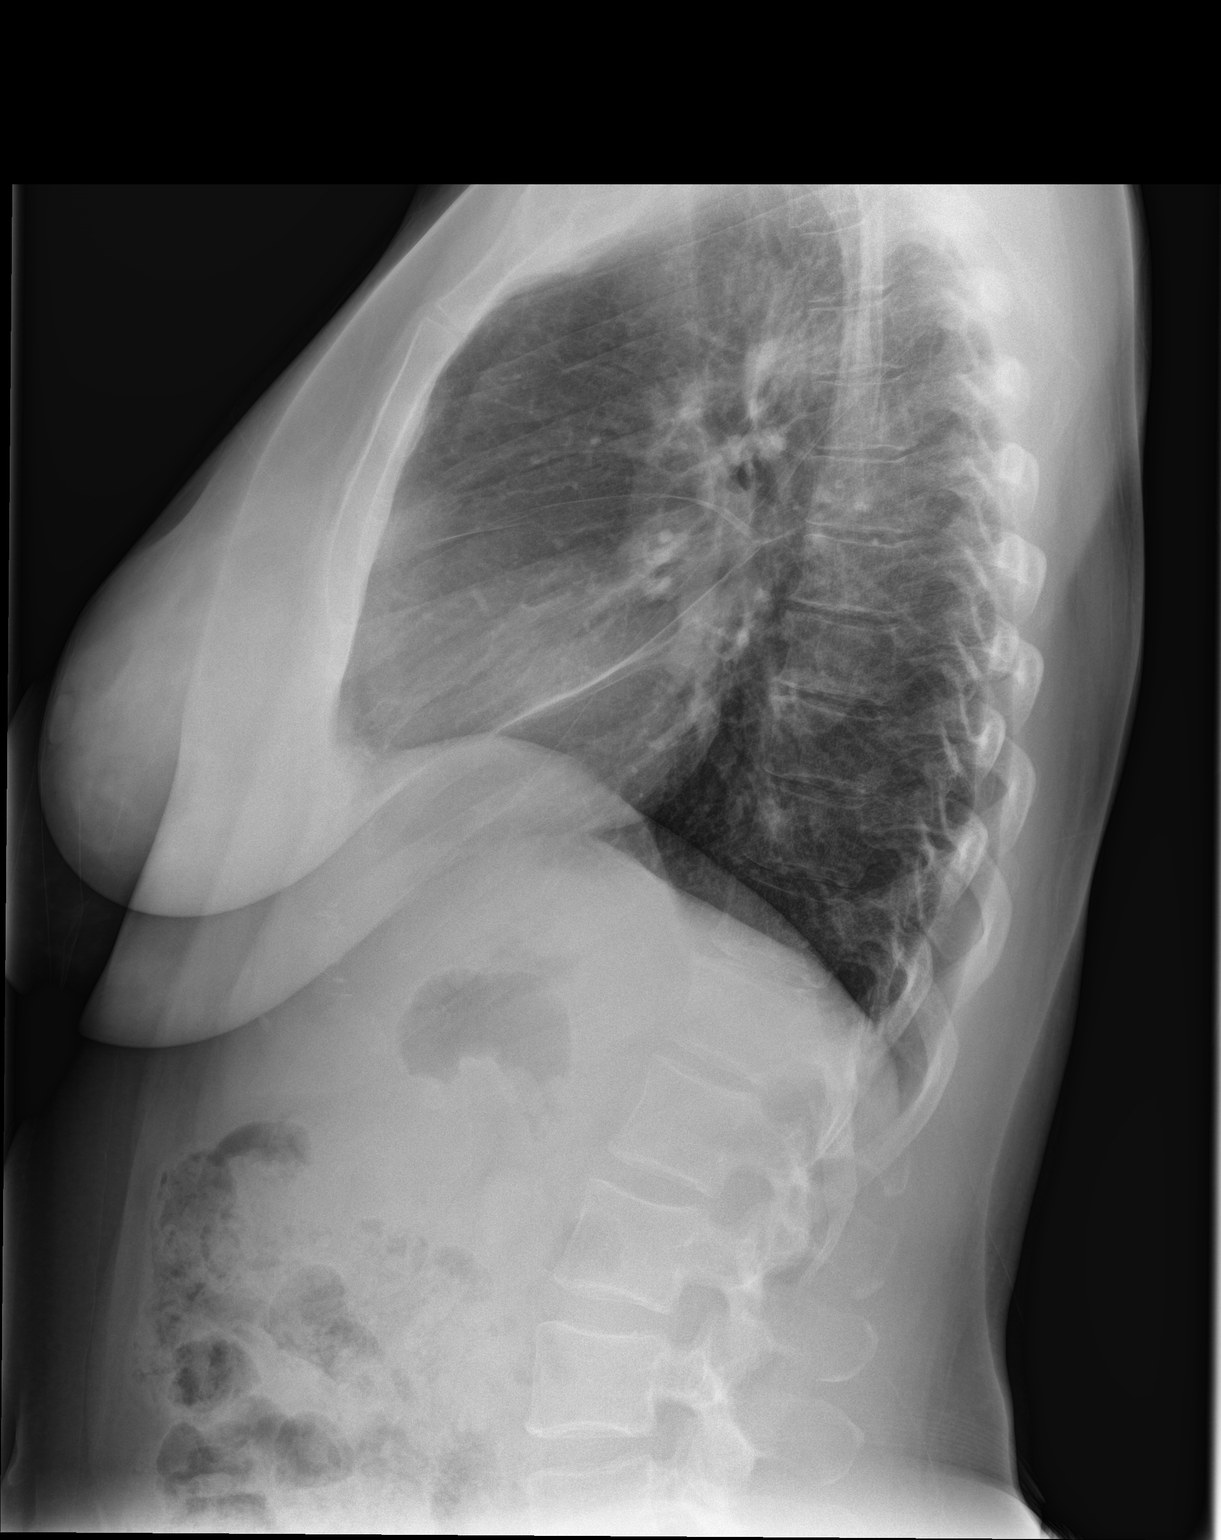

[2 of 2 positions shown; findings below may reference images not displayed]

FINDINGS: No active infiltrate or effusion is seen. No evidence of mediastinal
or hilar adenopathy is noted. The heart is mildly enlarged and
stable. No bony abnormality is seen.
IMPRESSION: No active cardiopulmonary disease.  Stable borderline cardiomegaly.

## 2020-04-07 ENCOUNTER — Other Ambulatory Visit: Payer: Self-pay

## 2020-04-07 ENCOUNTER — Ambulatory Visit: Payer: 59 | Admitting: Dermatology

## 2020-04-07 DIAGNOSIS — D869 Sarcoidosis, unspecified: Secondary | ICD-10-CM | POA: Diagnosis not present

## 2020-04-07 NOTE — Progress Notes (Signed)
   Follow-Up Visit   Subjective  Natalie Higgins is a 59 y.o. female who presents for the following: sarcoidosis (of face and scalp - well controlled on Plaquenil 200mg  po BID, and Clobetasol sol QD PRN). The patient is very pleased with her improvement and she is having no side effects.  The facial sarcoid lesions as well as the scalp lesions are significantly improved and she has had hair regrowth.  She did see her eye doctor at [facility] and these records were reviewed.  He did a Plaquenil exam and she is doing fine.  He has been see her in 1 year for follow-up.  The following portions of the chart were reviewed this encounter and updated as appropriate:  Allergies  Meds  Problems  Med Hx  Surg Hx  Fam Hx     Review of Systems:  No other skin or systemic complaints except as noted in HPI or Assessment and Plan.  Objective  Well appearing patient in no apparent distress; mood and affect are within normal limits.  A focused examination was performed including the face and scalp. Relevant physical exam findings are noted in the Assessment and Plan.  Objective  face and scalp: Residual plaques of the face but much improved than in the past.  Areas of regrowth in the scalp.   Images          Assessment & Plan  Sarcoidosis -improving on oral Plaquenil systemic treatment without side effects.  Not to goal face and scalp Improving but persistent - hair re-growing and shrinking plaques of face and scalp.  Pt is pleased with improvement.  No side effects from Plaquenil.  See improvement in today's photos compared to last visit. No lung symptoms (but sarcoid lung involvement in past).   Annual CXR by PCP have been clear.    Recent ophthalmology exam showed:  "1. Plaquenil Toxicity Screening Dx: Sarcoidosis Dose: 200 mg BID Time: Since May 2021 Subj: Stbl Exam: No Bulls eye maculopathy HVF: OD: unreliable test (fixation losses), non sp depressed points OS: normal OCT: No EZ  loss FAF: NO bull's eye maculopathy A/P: No signs of plaquenil toxicity on exam and imaging studies, c/w current regimen. Current dosing is acceptable."  Reviewed labs from 01/17/20 which were normal.  Plan repeat labs on follow-up.  Continue Plaquenil 200 mg 1 po bid. we discussed increasing dose but patient is to stay on current dose and I agree being on it longer may continue to show improvement. Continue Clobetasol sol to active areas.  Return in about 4 months (around 08/08/2020) for sarcoidosis follow up .  Luther Redo, CMA, am acting as scribe for Sarina Ser, MD .   Documentation: I have reviewed the above documentation for accuracy and completeness, and I agree with the above.  Sarina Ser, MD

## 2020-04-08 ENCOUNTER — Encounter: Payer: Self-pay | Admitting: Dermatology

## 2020-06-27 ENCOUNTER — Other Ambulatory Visit: Payer: Self-pay | Admitting: Dermatology

## 2020-08-04 ENCOUNTER — Other Ambulatory Visit: Payer: Self-pay

## 2020-08-04 ENCOUNTER — Ambulatory Visit: Payer: 59 | Admitting: Dermatology

## 2020-08-04 DIAGNOSIS — D869 Sarcoidosis, unspecified: Secondary | ICD-10-CM | POA: Diagnosis not present

## 2020-08-04 MED ORDER — HYDROXYCHLOROQUINE SULFATE 200 MG PO TABS: 200.0000 mg | ORAL_TABLET | Freq: Two times a day (BID) | ORAL | 6 refills | Status: DC

## 2020-08-04 NOTE — Progress Notes (Signed)
   Follow-Up Visit   Subjective  Natalie Higgins is a 60 y.o. female who presents for the following: Follow-up (Patient here today for 4 month sarcoidosis follow up at the face and scalp. She is taking plaquenil 200mg  twice daily with no side effects. Patient not using anything else to treat. Patient feels that she is better. ). Since starting Plaquenil, pts hair has re-grown significantly and no longer falling out; her facial Sarcoid papules have resolved. She had lung Sarcoid in remote past, but it resolved years ago.  She has no breathing or lung symptoms at this time. She saw Ophthalmologist recently for Plaquenil eye check and is fine and is scheduled for recheck of eyes in 18 mos.  The following portions of the chart were reviewed this encounter and updated as appropriate:   Allergies  Meds  Problems  Med Hx  Surg Hx  Fam Hx      Review of Systems:  No other skin or systemic complaints except as noted in HPI or Assessment and Plan.  Objective  Well appearing patient in no apparent distress; mood and affect are within normal limits.  A focused examination was performed including face, scalp. Relevant physical exam findings are noted in the Assessment and Plan.  Objective  Scalp: Regrowth of hair in all areas of scalp but thin. Periorbital and nasal areas appear clear today, pt previously with sarcoidal lesions.   Assessment & Plan  Sarcoidosis - cutaneous.  No lung symptoms.  Improving on systemic oral Hydroxychloroquine without side effects Scalp; face Chronic; persistent. Improved, but not to goal. Patient does see Dr. Dorthula Perfect in [place]. She has a follow up with him 08/14/2020 for evaluation of Lung Sarcoid - in remission currently. Previous hx of lung involvement, resolved. Skin involvement is recent but controlled. Patient had eye appt with Dr. Moishe Spice in Oct 2021 - eyes are fine with respect to Hydroxychloroquine and she was told she does not need to be seen again for 18  months.   Continue plaquenil 200mg  twice daily.   CMP and CBC ordered today.   Opthalmology exam from 03/17/20 showed - 1. Plaquenil Toxicity Screening Dx: Sarcoidosis Dose: 200 mg BID Time: Since May 2021 Subj: Stbl Exam: No Bulls eye maculopathy HVF: OD: unreliable test (fixation losses), non sp depressed points OS: normal OCT: No EZ loss FAF: NO bull's eye maculopathy A/P: No signs of plaquenil toxicity on exam and imaging studies, c/w current regimen. Current dosing is acceptable.  Other Related Procedures CBC with Differential/Platelets CMP  Ordered Medications: hydroxychloroquine (PLAQUENIL) 200 MG tablet  Return in about 6 months (around 02/01/2021) for sarcoidosis.  Graciella Belton, RMA, am acting as scribe for Sarina Ser, MD . Documentation: I have reviewed the above documentation for accuracy and completeness, and I agree with the above.  Sarina Ser, MD

## 2020-08-05 ENCOUNTER — Encounter: Payer: Self-pay | Admitting: Dermatology

## 2020-08-22 LAB — COMPREHENSIVE METABOLIC PANEL
ALT: 15 IU/L (ref 0–32)
AST: 16 IU/L (ref 0–40)
Albumin/Globulin Ratio: 1.7 (ref 1.2–2.2)
Albumin: 4.3 g/dL (ref 3.8–4.9)
Alkaline Phosphatase: 67 IU/L (ref 44–121)
BUN/Creatinine Ratio: 17 (ref 9–23)
BUN: 14 mg/dL (ref 6–24)
Bilirubin Total: 0.3 mg/dL (ref 0.0–1.2)
CO2: 23 mmol/L (ref 20–29)
Calcium: 9.5 mg/dL (ref 8.7–10.2)
Chloride: 101 mmol/L (ref 96–106)
Creatinine, Ser: 0.83 mg/dL (ref 0.57–1.00)
Globulin, Total: 2.6 g/dL (ref 1.5–4.5)
Glucose: 86 mg/dL (ref 65–99)
Potassium: 4.6 mmol/L (ref 3.5–5.2)
Sodium: 138 mmol/L (ref 134–144)
Total Protein: 6.9 g/dL (ref 6.0–8.5)
eGFR: 81 mL/min/{1.73_m2} (ref >59)

## 2020-08-22 LAB — CBC WITH DIFFERENTIAL/PLATELET
Basophils Absolute: 0.1 10*3/uL (ref 0.0–0.2)
Basos: 1 %
EOS (ABSOLUTE): 0.1 10*3/uL (ref 0.0–0.4)
Eos: 1 %
Hematocrit: 43.1 % (ref 34.0–46.6)
Hemoglobin: 14 g/dL (ref 11.1–15.9)
Immature Grans (Abs): 0 10*3/uL (ref 0.0–0.1)
Immature Granulocytes: 0 %
Lymphocytes Absolute: 2.4 10*3/uL (ref 0.7–3.1)
Lymphs: 34 %
MCH: 28.3 pg (ref 26.6–33.0)
MCHC: 32.5 g/dL (ref 31.5–35.7)
MCV: 87 fL (ref 79–97)
Monocytes Absolute: 0.4 10*3/uL (ref 0.1–0.9)
Monocytes: 6 %
Neutrophils Absolute: 4.1 10*3/uL (ref 1.4–7.0)
Neutrophils: 58 %
Platelets: 309 10*3/uL (ref 150–450)
RBC: 4.95 x10E6/uL (ref 3.77–5.28)
RDW: 13.3 % (ref 11.7–15.4)
WBC: 7.1 10*3/uL (ref 3.4–10.8)

## 2020-09-01 ENCOUNTER — Telehealth: Payer: Self-pay

## 2020-09-01 NOTE — Telephone Encounter (Signed)
LM on VM labs all WNL okay to continue Plaquenil tablets

## 2020-09-01 NOTE — Telephone Encounter (Signed)
-----   Message from Ralene Bathe, MD sent at 08/29/2020  2:00 PM EDT ----- Blood counts and Chemistries from 08/21/20 = ALL NORMAL Continue current treatment - Plaquenil (for Sarcoidosis) Keep follow up appts

## 2020-09-08 ENCOUNTER — Other Ambulatory Visit: Payer: Self-pay

## 2020-09-08 ENCOUNTER — Ambulatory Visit: Payer: 59 | Admitting: Podiatry

## 2020-09-08 ENCOUNTER — Ambulatory Visit (INDEPENDENT_AMBULATORY_CARE_PROVIDER_SITE_OTHER): Payer: 59

## 2020-09-08 DIAGNOSIS — M7661 Achilles tendinitis, right leg: Secondary | ICD-10-CM | POA: Diagnosis not present

## 2020-09-08 DIAGNOSIS — M779 Enthesopathy, unspecified: Secondary | ICD-10-CM | POA: Diagnosis not present

## 2020-09-08 NOTE — Patient Instructions (Signed)

## 2020-09-09 ENCOUNTER — Other Ambulatory Visit: Payer: Self-pay | Admitting: Podiatry

## 2020-09-09 DIAGNOSIS — M7661 Achilles tendinitis, right leg: Secondary | ICD-10-CM

## 2020-09-10 DIAGNOSIS — M7661 Achilles tendinitis, right leg: Secondary | ICD-10-CM

## 2020-09-10 MED ORDER — TRIAMCINOLONE ACETONIDE 10 MG/ML IJ SUSP
10.0000 mg | Freq: Once | INTRAMUSCULAR | Status: AC
  Administered 2020-09-10: 10 mg

## 2020-09-10 NOTE — Progress Notes (Signed)
Subjective:   Patient ID: Natalie Higgins, female   DOB: 60 y.o.   MRN: [id number]   HPI Patient presents stating that she has discomfort in the back of the right heel and does not remember specific injury.  States it is been bothering her and making it hard to walk comfortably and that this is been an ongoing issue for her and she does not remember when it may have started and does think she has bone spur formation.  States it is hard to wear shoe gear and patient would like to be more active and does not currently smoke   Review of Systems  All other systems reviewed and are negative.       Objective:  Physical Exam Vitals and nursing note reviewed.  Constitutional:      Appearance: She is well-developed.  Pulmonary:     Effort: Pulmonary effort is normal.  Musculoskeletal:        General: Normal range of motion.  Skin:    General: Skin is warm.  Neurological:     Mental Status: She is alert.     Neurovascular status found to be intact muscle strength was found to be adequate range of motion adequate with patient found to have quite a bit of inflammation posterior aspect of the right heel at the insertion of the Achilles into the calcaneus.  Mild equinus mild obesity is noted and the central medial portion of the tendon is healthy.  Patient has good digital perfusion well oriented x3     Assessment:  Acute Achilles tendinitis right with inflammation pain     Plan:  H&P reviewed condition and x-ray.  There is acute inflammation on the side and I did discuss injection explained the risk associated with this.  Patient wants to have this done understanding risk and I did a sterile prep today I injected the lateral tendon insertion 3 mg dexamethasone Kenalog 5 mg Xylocaine I advised on ice therapy reduced activity and gave instructions for gradual stretching exercises shoe gear modifications heel lift  X-rays indicate moderate spur formation posterior aspect heel no indication  stress fracture or advanced arthritis

## 2020-12-22 ENCOUNTER — Other Ambulatory Visit: Payer: Self-pay | Admitting: Obstetrics and Gynecology

## 2020-12-22 DIAGNOSIS — Z1231 Encounter for screening mammogram for malignant neoplasm of breast: Secondary | ICD-10-CM

## 2021-01-22 ENCOUNTER — Other Ambulatory Visit: Payer: Self-pay

## 2021-01-22 ENCOUNTER — Ambulatory Visit
Admission: RE | Admit: 2021-01-22 | Discharge: 2021-01-22 | Disposition: A | Discharge status: Home / Self Care | Payer: 59 | Source: Ambulatory Visit | Attending: Obstetrics and Gynecology | Admitting: Obstetrics and Gynecology

## 2021-01-22 DIAGNOSIS — Z1231 Encounter for screening mammogram for malignant neoplasm of breast: Secondary | ICD-10-CM | POA: Diagnosis not present

## 2021-01-29 ENCOUNTER — Ambulatory Visit: Payer: 59 | Admitting: Dermatology

## 2021-01-29 ENCOUNTER — Other Ambulatory Visit: Payer: Self-pay

## 2021-01-29 DIAGNOSIS — D863 Sarcoidosis of skin: Secondary | ICD-10-CM | POA: Diagnosis not present

## 2021-01-29 DIAGNOSIS — D869 Sarcoidosis, unspecified: Secondary | ICD-10-CM

## 2021-01-29 NOTE — Patient Instructions (Signed)

## 2021-01-29 NOTE — Progress Notes (Signed)
   Follow-Up Visit   Subjective  Natalie Higgins is a 60 y.o. female who presents for the following: Skin Problem (6 months f/u on the scalp sarcoidosis, pt taking Plaquenil 200 mg once a day ).  Her sarcoidosis of the scalp is doing much better and she has had some hair regrowth.  The spots on her face have melted away for the most part. She recently saw her ophthalmologist at [facility] and her eyes are doing fine on Plaquenil.  She is tolerating Plaquenil fine.  The following portions of the chart were reviewed this encounter and updated as appropriate:   Allergies  Meds  Problems  Med Hx  Surg Hx  Fam Hx     Review of Systems:  No other skin or systemic complaints except as noted in HPI or Assessment and Plan.  Objective  Well appearing patient in no apparent distress; mood and affect are within normal limits.  A focused examination was performed including face,scalp. Relevant physical exam findings are noted in the Assessment and Plan.  Head - Anterior (Face) Texture change at the right medial canthus, scalp mainly clear    Assessment & Plan  Sarcoidosis Scalp and face Sarcoidosis - cutaneous.  No lung symptoms.  Improving on systemic oral Hydroxychloroquine without side effects Scalp; face Chronic; persistent. Improved, but not to goal. Lung Sarcoid - in remission currently. Previous hx of lung involvement, resolved. Skin involvement is recent but better controlled. Patient had eye appt with Dr. Moishe Spice in July 2022 - eyes are fine with respect to Hydroxychloroquine   Continue plaquenil '200mg'$   daily.    Labs reviewed from March 2022 WNL   Opthalmology exam from 03/17/20 showed - 1. Plaquenil Toxicity Screening Dx: Sarcoidosis Dose: 200 mg QD Time: Since May 2021 Subj: Stbl Exam: No Bulls eye maculopathy HVF: OD: unreliable test (fixation losses), non sp depressed points OS: normal OCT: No EZ loss FAF: NO bull's eye maculopathy A/P: No signs of plaquenil  toxicity on exam and imaging studies, c/w current regimen. Current dosing is acceptable.   Related Medications hydroxychloroquine (PLAQUENIL) 200 MG tablet Take 1 tablet (200 mg total) by mouth 2 (two) times daily.  Return in about 6 months (around 08/01/2021) for Sarcoidosis . Will plan labs at follow-up unless already done by her PCP.  IMarye Round, CMA, am acting as scribe for Sarina Ser, MD .  Documentation: I have reviewed the above documentation for accuracy and completeness, and I agree with the above.  Sarina Ser, MD

## 2021-02-02 ENCOUNTER — Encounter: Payer: Self-pay | Admitting: Dermatology

## 2021-05-12 ENCOUNTER — Other Ambulatory Visit: Payer: Self-pay | Admitting: Dermatology

## 2021-05-12 DIAGNOSIS — D869 Sarcoidosis, unspecified: Secondary | ICD-10-CM

## 2021-07-23 ENCOUNTER — Other Ambulatory Visit: Payer: Self-pay

## 2021-07-23 ENCOUNTER — Ambulatory Visit: Payer: 59 | Admitting: Dermatology

## 2021-07-23 DIAGNOSIS — D863 Sarcoidosis of skin: Secondary | ICD-10-CM | POA: Diagnosis not present

## 2021-07-23 DIAGNOSIS — Z79899 Other long term (current) drug therapy: Secondary | ICD-10-CM

## 2021-07-23 DIAGNOSIS — L659 Nonscarring hair loss, unspecified: Secondary | ICD-10-CM | POA: Diagnosis not present

## 2021-07-23 NOTE — Patient Instructions (Signed)

## 2021-07-23 NOTE — Progress Notes (Signed)
° °  Follow-Up Visit   Subjective  Natalie Higgins is a 61 y.o. female who presents for the following: Follow-up (6 month follow up - Sarcoidosis  - no lung issues. Plaquenil 200 mg bid - well controlled. She gets regular eye exams and they have been WNL). She continues to improve with Plaquenil treatment, but still has scalp involvement.  The following portions of the chart were reviewed this encounter and updated as appropriate:   Allergies   Meds   Problems   Med Hx   Surg Hx   Fam Hx       Review of Systems:  No other skin or systemic complaints except as noted in HPI or Assessment and Plan.  Objective  Well appearing patient in no apparent distress; mood and affect are within normal limits.  A focused examination was performed including face, scalp. Relevant physical exam findings are noted in the Assessment and Plan.  Plaque of post neck      Assessment & Plan  Sarcoidosis of skin  Sarcoidosis - cutaneous.  No lung symptoms.  Improving on systemic oral Hydroxychloroquine without side effects Scalp; face Chronic and persistent condition with duration or expected duration over one year. Condition is symptomatic/ bothersome to patient. Not currently at goal.  Lung Sarcoid - in remission currently. Previous hx of lung involvement, resolved. Skin involvement is recent but better controlled. Patient had eye exam - eyes are fine with respect to Hydroxychloroquine    Continue plaquenil 200mg  bid (pt declines increasing dose today)   CMP CBC With Differential  Long term medication management.  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  I, Ashok Cordia, CMA, am acting as scribe for Sarina Ser, MD . Documentation: I have reviewed the above documentation for accuracy and completeness, and I agree with the above.  Sarina Ser, MD

## 2021-07-28 ENCOUNTER — Encounter: Payer: Self-pay | Admitting: Dermatology

## 2021-08-14 LAB — COMPREHENSIVE METABOLIC PANEL
ALT: 15 IU/L (ref 0–32)
AST: 17 IU/L (ref 0–40)
Albumin/Globulin Ratio: 1.8 (ref 1.2–2.2)
Albumin: 4.2 g/dL (ref 3.8–4.9)
Alkaline Phosphatase: 63 IU/L (ref 44–121)
BUN/Creatinine Ratio: 10 — ABNORMAL LOW (ref 12–28)
BUN: 9 mg/dL (ref 8–27)
Bilirubin Total: 0.3 mg/dL (ref 0.0–1.2)
CO2: 25 mmol/L (ref 20–29)
Calcium: 9.1 mg/dL (ref 8.7–10.3)
Chloride: 102 mmol/L (ref 96–106)
Creatinine, Ser: 0.86 mg/dL (ref 0.57–1.00)
Globulin, Total: 2.3 g/dL (ref 1.5–4.5)
Glucose: 115 mg/dL — ABNORMAL HIGH (ref 70–99)
Potassium: 4.5 mmol/L (ref 3.5–5.2)
Sodium: 139 mmol/L (ref 134–144)
Total Protein: 6.5 g/dL (ref 6.0–8.5)
eGFR: 77 mL/min/{1.73_m2} (ref >59)

## 2021-08-14 LAB — CBC WITH DIFFERENTIAL
Basophils Absolute: 0.1 10*3/uL (ref 0.0–0.2)
Basos: 1 %
EOS (ABSOLUTE): 0.1 10*3/uL (ref 0.0–0.4)
Eos: 1 %
Hematocrit: 42.1 % (ref 34.0–46.6)
Hemoglobin: 13.7 g/dL (ref 11.1–15.9)
Immature Grans (Abs): 0 10*3/uL (ref 0.0–0.1)
Immature Granulocytes: 0 %
Lymphocytes Absolute: 3 10*3/uL (ref 0.7–3.1)
Lymphs: 38 %
MCH: 28.2 pg (ref 26.6–33.0)
MCHC: 32.5 g/dL (ref 31.5–35.7)
MCV: 87 fL (ref 79–97)
Monocytes Absolute: 0.4 10*3/uL (ref 0.1–0.9)
Monocytes: 5 %
Neutrophils Absolute: 4.4 10*3/uL (ref 1.4–7.0)
Neutrophils: 55 %
RBC: 4.86 x10E6/uL (ref 3.77–5.28)
RDW: 13 % (ref 11.7–15.4)
WBC: 8 10*3/uL (ref 3.4–10.8)

## 2021-08-18 ENCOUNTER — Telehealth: Payer: Self-pay

## 2021-08-18 NOTE — Telephone Encounter (Signed)
Left pt message to call for lab results/sh ?

## 2021-08-18 NOTE — Telephone Encounter (Signed)
-----   Message from Ralene Bathe, MD sent at 08/14/2021  4:16 PM EST ----- ?Lab from 08/13/2021 = Chemistries and Blood counts ?Are all OK. ?Continue Hydroxychloroquine for Sarcoidosis ?Keep follow up appt. ?

## 2021-08-20 NOTE — Telephone Encounter (Signed)
Advised pt of lab results and to continue Hydroxychloroquine as prescribed./sh ?

## 2021-08-23 ENCOUNTER — Other Ambulatory Visit: Payer: Self-pay | Admitting: Dermatology

## 2021-08-23 DIAGNOSIS — D869 Sarcoidosis, unspecified: Secondary | ICD-10-CM

## 2021-11-12 ENCOUNTER — Other Ambulatory Visit: Payer: Self-pay | Admitting: Gerontology

## 2021-11-12 DIAGNOSIS — Z1231 Encounter for screening mammogram for malignant neoplasm of breast: Secondary | ICD-10-CM

## 2021-11-24 ENCOUNTER — Other Ambulatory Visit: Payer: Self-pay | Admitting: Dermatology

## 2021-11-24 DIAGNOSIS — D869 Sarcoidosis, unspecified: Secondary | ICD-10-CM

## 2022-01-25 ENCOUNTER — Ambulatory Visit: Payer: 59 | Admitting: Dermatology

## 2022-01-25 ENCOUNTER — Ambulatory Visit
Admission: RE | Admit: 2022-01-25 | Discharge: 2022-01-25 | Disposition: A | Discharge status: Home / Self Care | Payer: 59 | Source: Ambulatory Visit | Attending: Gerontology | Admitting: Gerontology

## 2022-01-25 DIAGNOSIS — Z1231 Encounter for screening mammogram for malignant neoplasm of breast: Secondary | ICD-10-CM | POA: Diagnosis present

## 2022-01-25 DIAGNOSIS — L819 Disorder of pigmentation, unspecified: Secondary | ICD-10-CM | POA: Diagnosis not present

## 2022-01-25 DIAGNOSIS — Z79899 Other long term (current) drug therapy: Secondary | ICD-10-CM | POA: Diagnosis not present

## 2022-01-25 DIAGNOSIS — D869 Sarcoidosis, unspecified: Secondary | ICD-10-CM | POA: Diagnosis not present

## 2022-01-25 NOTE — Patient Instructions (Signed)
Due to recent changes in healthcare laws, you may see results of your pathology and/or laboratory studies on MyChart before the doctors have had a chance to review them. We understand that in some cases there may be results that are confusing or concerning to you. Please understand that not all results are received at the same time and often the doctors may need to interpret multiple results in order to provide you with the best plan of care or course of treatment. Therefore, we ask that you please give us 2 business days to thoroughly review all your results before contacting the office for clarification. Should we see a critical lab result, you will be contacted sooner.   If You Need Anything After Your Visit  If you have any questions or concerns for your doctor, please call our main line at [phone] and press option 4 to reach your doctor's medical assistant. If no one answers, please leave a voicemail as directed and we will return your call as soon as possible. Messages left after 4 pm will be answered the following business day.   You may also send us a message via MyChart. We typically respond to MyChart messages within 1-2 business days.  For prescription refills, please ask your pharmacy to contact our office. Our fax number is [phone].  If you have an urgent issue when the clinic is closed that cannot wait until the next business day, you can page your doctor at the number below.    Please note that while we do our best to be available for urgent issues outside of office hours, we are not available 24/7.   If you have an urgent issue and are unable to reach us, you may choose to seek medical care at your doctor's office, retail clinic, urgent care center, or emergency room.  If you have a medical emergency, please immediately call 911 or go to the emergency department.  Pager Numbers  - Dr. Kowalski: [phone]  - Dr. Moye: [phone]  - Dr. Stewart:  [phone]  In the event of inclement weather, please call our main line at [phone] for an update on the status of any delays or closures.  Dermatology Medication Tips: Please keep the boxes that topical medications come in in order to help keep track of the instructions about where and how to use these. Pharmacies typically print the medication instructions only on the boxes and not directly on the medication tubes.   If your medication is too expensive, please contact our office at [phone] option 4 or send us a message through MyChart.   We are unable to tell what your co-pay for medications will be in advance as this is different depending on your insurance coverage. However, we may be able to find a substitute medication at lower cost or fill out paperwork to get insurance to cover a needed medication.   If a prior authorization is required to get your medication covered by your insurance company, please allow us 1-2 business days to complete this process.  Drug prices often vary depending on where the prescription is filled and some pharmacies may offer cheaper prices.  The website www.goodrx.com contains coupons for medications through different pharmacies. The prices here do not account for what the cost may be with help from insurance (it may be cheaper with your insurance), but the website can give you the price if you did not use any insurance.  - You can print the associated coupon and take it with   your prescription to the pharmacy.  - You may also stop by our office during regular business hours and pick up a GoodRx coupon card.  - If you need your prescription sent electronically to a different pharmacy, notify our office through [facility] MyChart or by phone at [phone] option 4.     Si Usted Necesita Algo Despus de Su Visita  Tambin puede enviarnos un mensaje a travs de MyChart. Por lo general respondemos a los mensajes de MyChart en el transcurso de 1 a 2  das hbiles.  Para renovar recetas, por favor pida a su farmacia que se ponga en contacto con nuestra oficina. Nuestro nmero de fax es el [phone].  Si tiene un asunto urgente cuando la clnica est cerrada y que no puede esperar hasta el siguiente da hbil, puede llamar/localizar a su doctor(a) al nmero que aparece a continuacin.   Por favor, tenga en cuenta que aunque hacemos todo lo posible para estar disponibles para asuntos urgentes fuera del horario de oficina, no estamos disponibles las 24 horas del da, los 7 das de la semana.   Si tiene un problema urgente y no puede comunicarse con nosotros, puede optar por buscar atencin mdica  en el consultorio de su doctor(a), en una clnica privada, en un centro de atencin urgente o en una sala de emergencias.  Si tiene una emergencia mdica, por favor llame inmediatamente al 911 o vaya a la sala de emergencias.  Nmeros de bper  - Dr. Kowalski: [phone]  - Dra. Moye: [phone]  - Dra. Stewart: [phone]  En caso de inclemencias del tiempo, por favor llame a nuestra lnea principal al [phone] para una actualizacin sobre el estado de cualquier retraso o cierre.  Consejos para la medicacin en dermatologa: Por favor, guarde las cajas en las que vienen los medicamentos de uso tpico para ayudarle a seguir las instrucciones sobre dnde y cmo usarlos. Las farmacias generalmente imprimen las instrucciones del medicamento slo en las cajas y no directamente en los tubos del medicamento.   Si su medicamento es muy caro, por favor, pngase en contacto con nuestra oficina llamando al [phone] y presione la opcin 4 o envenos un mensaje a travs de MyChart.   No podemos decirle cul ser su copago por los medicamentos por adelantado ya que esto es diferente dependiendo de la cobertura de su seguro. Sin embargo, es posible que podamos encontrar un medicamento sustituto a menor costo o llenar un formulario para que el  seguro cubra el medicamento que se considera necesario.   Si se requiere una autorizacin previa para que su compaa de seguros cubra su medicamento, por favor permtanos de 1 a 2 das hbiles para completar este proceso.  Los precios de los medicamentos varan con frecuencia dependiendo del lugar de dnde se surte la receta y alguna farmacias pueden ofrecer precios ms baratos.  El sitio web www.goodrx.com tiene cupones para medicamentos de diferentes farmacias. Los precios aqu no tienen en cuenta lo que podra costar con la ayuda del seguro (puede ser ms barato con su seguro), pero el sitio web puede darle el precio si no utiliz ningn seguro.  - Puede imprimir el cupn correspondiente y llevarlo con su receta a la farmacia.  - Tambin puede pasar por nuestra oficina durante el horario de atencin regular y recoger una tarjeta de cupones de GoodRx.  - Si necesita que su receta se enve electrnicamente a una farmacia diferente, informe a nuestra oficina a travs de MyChart [facility]   o por telfono llamando al [phone] y presione la opcin 4.  

## 2022-01-25 NOTE — Progress Notes (Signed)
   Follow-Up Visit   Subjective  Natalie Higgins is a 61 y.o. female who presents for the following: Sarcoid (Patient currently using Plaquenil 200 mg po BID and had opthalmology exam on 10/15/21 and had no toxicity on exam or OCT. She is tolerating Plaquenil well without s/e and condition has been under control). She has improved significantly on treatments.  The following portions of the chart were reviewed this encounter and updated as appropriate:   Allergies  Meds  Problems  Med Hx  Surg Hx  Fam Hx     Review of Systems:  No other skin or systemic complaints except as noted in HPI or Assessment and Plan.  Objective  Well appearing patient in no apparent distress; mood and affect are within normal limits.  A focused examination was performed including the face. Relevant physical exam findings are noted in the Assessment and Plan.  Scalp Face and scalp clear today. Some thinning around the post hairline and some hyperpigmentation of the scalp, otherwise doing well.   Assessment & Plan  Sarcoid of skin -  face (perinasal and periorbital -now clear) and scalp with alopecia -now much improved with regrowth of most of the hair.  Some hyperpigmentation of the scalp Scalp: Face  Reviewed labs and most recent ophthalmology note -   "Plaquenil use - no signs of toxicity today (10/15/21) on exam or OCT - has been using plaquenil '200mg'$  BID mg daily since 10/2019 The American Academy of Ophthalmology recommends a maximum daily hydroxychloroquine (plaquenil) use of 5.0 mg/kg real weight, which correlates better with risk than ideal weight.  Marmor et al. Recommendations on Screening for Chloroquine and Hydroxychloroquine Retinopathy (2016 Revision). Ophthalmology. June 2016. Volume 123, Issue 6, Pages [phone]"  Lung Sarcoid - in remission currently. Previous hx of lung involvement, resolved. Skin involvement is recent but better controlled. Patient had eye exam - eyes are fine with  respect to Hydroxychloroquine   Continue Hydroxychloroquine '200mg'$  po BID.   Dyschromia  Medication management  Long term medication management.  Patient is using long term (months to years) prescription medication  to control their dermatologic condition.  These medications require periodic monitoring to evaluate for efficacy and side effects and may require periodic laboratory monitoring.  Return in about 6 months (around 07/28/2022).  Luther Redo, CMA, am acting as scribe for Sarina Ser, MD . Documentation: I have reviewed the above documentation for accuracy and completeness, and I agree with the above.  Sarina Ser, MD

## 2022-02-01 ENCOUNTER — Encounter: Payer: Self-pay | Admitting: Dermatology

## 2022-02-21 ENCOUNTER — Other Ambulatory Visit: Payer: Self-pay | Admitting: Dermatology

## 2022-02-21 DIAGNOSIS — D869 Sarcoidosis, unspecified: Secondary | ICD-10-CM

## 2022-07-06 ENCOUNTER — Other Ambulatory Visit: Payer: Self-pay | Admitting: Dermatology

## 2022-07-06 DIAGNOSIS — D869 Sarcoidosis, unspecified: Secondary | ICD-10-CM

## 2022-07-29 ENCOUNTER — Ambulatory Visit: Payer: 59 | Admitting: Dermatology

## 2022-08-12 ENCOUNTER — Ambulatory Visit: Payer: 59 | Admitting: Dermatology

## 2022-08-12 VITALS — BP 170/90

## 2022-08-12 DIAGNOSIS — Z79899 Other long term (current) drug therapy: Secondary | ICD-10-CM | POA: Diagnosis not present

## 2022-08-12 DIAGNOSIS — D869 Sarcoidosis, unspecified: Secondary | ICD-10-CM | POA: Diagnosis not present

## 2022-08-12 NOTE — Progress Notes (Signed)
   Follow-Up Visit   Subjective  Natalie Higgins is a 62 y.o. female who presents for the following: Follow-up (6 month follow up Sarcoid - Plaquenil 200 mg 1 po qd - well controlled). She has grown most of her hair back but still has some thinning especially on the posterior scalp.  She has not had any new spots in the periorbital or perinasal area and they old ones seem to be resolved.  She is tolerating her Plaquenil well and she recently saw her ophthalmologist and her eyes are doing fine.  The following portions of the chart were reviewed this encounter and updated as appropriate:   Allergies  Meds  Problems  Med Hx  Surg Hx  Fam Hx     Review of Systems:  No other skin or systemic complaints except as noted in HPI or Assessment and Plan.  Objective  Well appearing patient in no apparent distress; mood and affect are within normal limits.  A focused examination was performed including scalp. Relevant physical exam findings are noted in the Assessment and Plan.   Assessment & Plan  Sarcoidosis Scalp  Sarcoid of skin -  face (perinasal and periorbital -now clear) and scalp with alopecia -now much improved with regrowth of most of the hair.  Some hyperpigmentation of the scalp    Reviewed labs from 11/2021 and most recent ophthalmology note - 02/2022   Plaquenil Toxicity Screening Dx: Sarcoidosis Dose: 200 mg BID Time: Since May 2021 Subj: Stbl Exam: No Bulls eye maculopathy HVF: OD: unreliable test (fixation losses), non sp depressed points OS: normal OCT: No EZ loss FAF: NO bull's eye maculopathy A/P: No signs of plaquenil toxicity on exam and imaging studies, c/w current regimen. Current dosing is acceptable.   Lung Sarcoid - in remission currently. Previous hx of lung involvement, resolved. Skin involvement is recent but better controlled. Patient had eye exam - eyes are fine with respect to Hydroxychloroquine    Continue Hydroxychloroquine '200mg'$  po qd.    Related Medications hydroxychloroquine (PLAQUENIL) 200 MG tablet TAKE 1 TABLET(200 MG) BY MOUTH TWICE DAILY   Return in about 6 months (around 02/10/2023).  I, Ashok Cordia, CMA, am acting as scribe for Sarina Ser, MD . Documentation: I have reviewed the above documentation for accuracy and completeness, and I agree with the above.  Sarina Ser, MD

## 2022-08-12 NOTE — Patient Instructions (Signed)
Due to recent changes in healthcare laws, you may see results of your pathology and/or laboratory studies on MyChart before the doctors have had a chance to review them. We understand that in some cases there may be results that are confusing or concerning to you. Please understand that not all results are received at the same time and often the doctors may need to interpret multiple results in order to provide you with the best plan of care or course of treatment. Therefore, we ask that you please give us 2 business days to thoroughly review all your results before contacting the office for clarification. Should we see a critical lab result, you will be contacted sooner.   If You Need Anything After Your Visit  If you have any questions or concerns for your doctor, please call our main line at [phone] and press option 4 to reach your doctor's medical assistant. If no one answers, please leave a voicemail as directed and we will return your call as soon as possible. Messages left after 4 pm will be answered the following business day.   You may also send us a message via MyChart. We typically respond to MyChart messages within 1-2 business days.  For prescription refills, please ask your pharmacy to contact our office. Our fax number is [phone].  If you have an urgent issue when the clinic is closed that cannot wait until the next business day, you can page your doctor at the number below.    Please note that while we do our best to be available for urgent issues outside of office hours, we are not available 24/7.   If you have an urgent issue and are unable to reach us, you may choose to seek medical care at your doctor's office, retail clinic, urgent care center, or emergency room.  If you have a medical emergency, please immediately call 911 or go to the emergency department.  Pager Numbers  - Dr. Kowalski: [phone]  - Dr. Moye: [phone]  - Dr. Stewart:  [phone]  In the event of inclement weather, please call our main line at [phone] for an update on the status of any delays or closures.  Dermatology Medication Tips: Please keep the boxes that topical medications come in in order to help keep track of the instructions about where and how to use these. Pharmacies typically print the medication instructions only on the boxes and not directly on the medication tubes.   If your medication is too expensive, please contact our office at [phone] option 4 or send us a message through MyChart.   We are unable to tell what your co-pay for medications will be in advance as this is different depending on your insurance coverage. However, we may be able to find a substitute medication at lower cost or fill out paperwork to get insurance to cover a needed medication.   If a prior authorization is required to get your medication covered by your insurance company, please allow us 1-2 business days to complete this process.  Drug prices often vary depending on where the prescription is filled and some pharmacies may offer cheaper prices.  The website www.goodrx.com contains coupons for medications through different pharmacies. The prices here do not account for what the cost may be with help from insurance (it may be cheaper with your insurance), but the website can give you the price if you did not use any insurance.  - You can print the associated coupon and take it with   your prescription to the pharmacy.  - You may also stop by our office during regular business hours and pick up a GoodRx coupon card.  - If you need your prescription sent electronically to a different pharmacy, notify our office through [facility] MyChart or by phone at [phone] option 4.     Si Usted Necesita Algo Despus de Su Visita  Tambin puede enviarnos un mensaje a travs de MyChart. Por lo general respondemos a los mensajes de MyChart en el transcurso de 1 a 2  das hbiles.  Para renovar recetas, por favor pida a su farmacia que se ponga en contacto con nuestra oficina. Nuestro nmero de fax es el [phone].  Si tiene un asunto urgente cuando la clnica est cerrada y que no puede esperar hasta el siguiente da hbil, puede llamar/localizar a su doctor(a) al nmero que aparece a continuacin.   Por favor, tenga en cuenta que aunque hacemos todo lo posible para estar disponibles para asuntos urgentes fuera del horario de oficina, no estamos disponibles las 24 horas del da, los 7 das de la semana.   Si tiene un problema urgente y no puede comunicarse con nosotros, puede optar por buscar atencin mdica  en el consultorio de su doctor(a), en una clnica privada, en un centro de atencin urgente o en una sala de emergencias.  Si tiene una emergencia mdica, por favor llame inmediatamente al 911 o vaya a la sala de emergencias.  Nmeros de bper  - Dr. Kowalski: [phone]  - Dra. Moye: [phone]  - Dra. Stewart: [phone]  En caso de inclemencias del tiempo, por favor llame a nuestra lnea principal al [phone] para una actualizacin sobre el estado de cualquier retraso o cierre.  Consejos para la medicacin en dermatologa: Por favor, guarde las cajas en las que vienen los medicamentos de uso tpico para ayudarle a seguir las instrucciones sobre dnde y cmo usarlos. Las farmacias generalmente imprimen las instrucciones del medicamento slo en las cajas y no directamente en los tubos del medicamento.   Si su medicamento es muy caro, por favor, pngase en contacto con nuestra oficina llamando al [phone] y presione la opcin 4 o envenos un mensaje a travs de MyChart.   No podemos decirle cul ser su copago por los medicamentos por adelantado ya que esto es diferente dependiendo de la cobertura de su seguro. Sin embargo, es posible que podamos encontrar un medicamento sustituto a menor costo o llenar un formulario para que el  seguro cubra el medicamento que se considera necesario.   Si se requiere una autorizacin previa para que su compaa de seguros cubra su medicamento, por favor permtanos de 1 a 2 das hbiles para completar este proceso.  Los precios de los medicamentos varan con frecuencia dependiendo del lugar de dnde se surte la receta y alguna farmacias pueden ofrecer precios ms baratos.  El sitio web www.goodrx.com tiene cupones para medicamentos de diferentes farmacias. Los precios aqu no tienen en cuenta lo que podra costar con la ayuda del seguro (puede ser ms barato con su seguro), pero el sitio web puede darle el precio si no utiliz ningn seguro.  - Puede imprimir el cupn correspondiente y llevarlo con su receta a la farmacia.  - Tambin puede pasar por nuestra oficina durante el horario de atencin regular y recoger una tarjeta de cupones de GoodRx.  - Si necesita que su receta se enve electrnicamente a una farmacia diferente, informe a nuestra oficina a travs de [facility]   o por telfono llamando al [phone] y presione la opcin 4.  

## 2022-08-20 ENCOUNTER — Encounter: Payer: Self-pay | Admitting: Dermatology

## 2022-09-20 ENCOUNTER — Encounter: Payer: Self-pay | Admitting: Podiatry

## 2022-09-20 ENCOUNTER — Ambulatory Visit: Payer: 59 | Admitting: Podiatry

## 2022-09-20 ENCOUNTER — Ambulatory Visit (INDEPENDENT_AMBULATORY_CARE_PROVIDER_SITE_OTHER): Payer: 59

## 2022-09-20 VITALS — BP 169/82 | HR 74

## 2022-09-20 DIAGNOSIS — M2041 Other hammer toe(s) (acquired), right foot: Secondary | ICD-10-CM

## 2022-09-20 DIAGNOSIS — L84 Corns and callosities: Secondary | ICD-10-CM

## 2022-09-20 NOTE — Patient Instructions (Signed)
Look for urea 40% cream or ointment and apply to the thickened dry skin / calluses. This can be bought over the counter, at a pharmacy or online such as [employer].    More silicone pads can be purchased from:  [email or web address]  

## 2022-09-21 NOTE — Progress Notes (Signed)
  Subjective:  Patient ID: Natalie Higgins, female    DOB: 16-Sep-1960,  MRN: [id number]  Chief Complaint  Patient presents with   Toe Pain    "I have a corn on my right pinkie toe." N - corn L - 5th rt D - 2 weeks O - suddenly C - tender, sore, aches sometime A - shoes T - none    62 y.o. female presents with the above complaint. History confirmed with patient.  Has something similar on the left side but is not as severe and does not hurt  Objective:  Physical Exam: warm, good capillary refill, no trophic changes or ulcerative lesions, normal DP and PT pulses, normal sensory exam, and adductovarus right fifth toe with hyperkeratosis over the PIPJ laterally and tender.   Radiographs: Multiple views x-ray of the right foot: digital contractures and adductovarus rotation fifth toe Assessment:   1. Hammer toe of right foot   2. Callus of foot      Plan:  Patient was evaluated and treated and all questions answered.  Hammertoe -XR reviewed with patient -Educated on etiology of deformity -Discussed padding and shoe gear changes -Hammertoe crest pad dispensed -Discussed with patient that they would benefit from surgical intervention after having failed all conservative therapy.  Return if symptoms worsen or fail to improve.

## 2022-10-14 ENCOUNTER — Telehealth: Payer: Self-pay

## 2022-10-14 NOTE — Telephone Encounter (Signed)
Patient advised of information per Dr. Kowalski. aw 

## 2022-10-14 NOTE — Telephone Encounter (Signed)
Patient left a message asking if taking a hair supplement would be beneficial for her condition?

## 2022-11-16 ENCOUNTER — Other Ambulatory Visit: Payer: Self-pay | Admitting: Dermatology

## 2022-11-16 DIAGNOSIS — D869 Sarcoidosis, unspecified: Secondary | ICD-10-CM

## 2023-01-03 ENCOUNTER — Other Ambulatory Visit: Payer: Self-pay | Admitting: Gerontology

## 2023-01-03 DIAGNOSIS — Z1231 Encounter for screening mammogram for malignant neoplasm of breast: Secondary | ICD-10-CM

## 2023-01-27 ENCOUNTER — Ambulatory Visit
Admission: RE | Admit: 2023-01-27 | Discharge: 2023-01-27 | Disposition: A | Discharge status: Home / Self Care | Payer: 59 | Source: Ambulatory Visit | Attending: Gerontology | Admitting: Gerontology

## 2023-01-27 DIAGNOSIS — Z1231 Encounter for screening mammogram for malignant neoplasm of breast: Secondary | ICD-10-CM | POA: Insufficient documentation

## 2023-02-13 ENCOUNTER — Other Ambulatory Visit: Payer: Self-pay | Admitting: Dermatology

## 2023-02-13 DIAGNOSIS — D869 Sarcoidosis, unspecified: Secondary | ICD-10-CM

## 2023-02-15 ENCOUNTER — Other Ambulatory Visit: Payer: Self-pay | Admitting: Dermatology

## 2023-02-15 DIAGNOSIS — D869 Sarcoidosis, unspecified: Secondary | ICD-10-CM

## 2023-02-15 NOTE — Telephone Encounter (Signed)
 Return to the office before any additional refills

## 2023-02-17 ENCOUNTER — Ambulatory Visit: Payer: 59 | Admitting: Dermatology

## 2023-07-07 ENCOUNTER — Ambulatory Visit: Payer: 59 | Admitting: Dermatology

## 2023-07-17 IMAGING — MG MM DIGITAL SCREENING BILAT W/ TOMO AND CAD
8 series · 8 of 24 positions shown · non-contrast
Comparison: Previous exam(s).

CLINICAL DATA: Screening.

EXAM:
DIGITAL SCREENING BILATERAL MAMMOGRAM WITH TOMOSYNTHESIS AND CAD
TECHNIQUE: Bilateral screening digital craniocaudal and mediolateral oblique
mammograms were obtained. Bilateral screening digital breast
tomosynthesis was performed. The images were evaluated with
computer-aided detection.

[L MLO synth-2D]
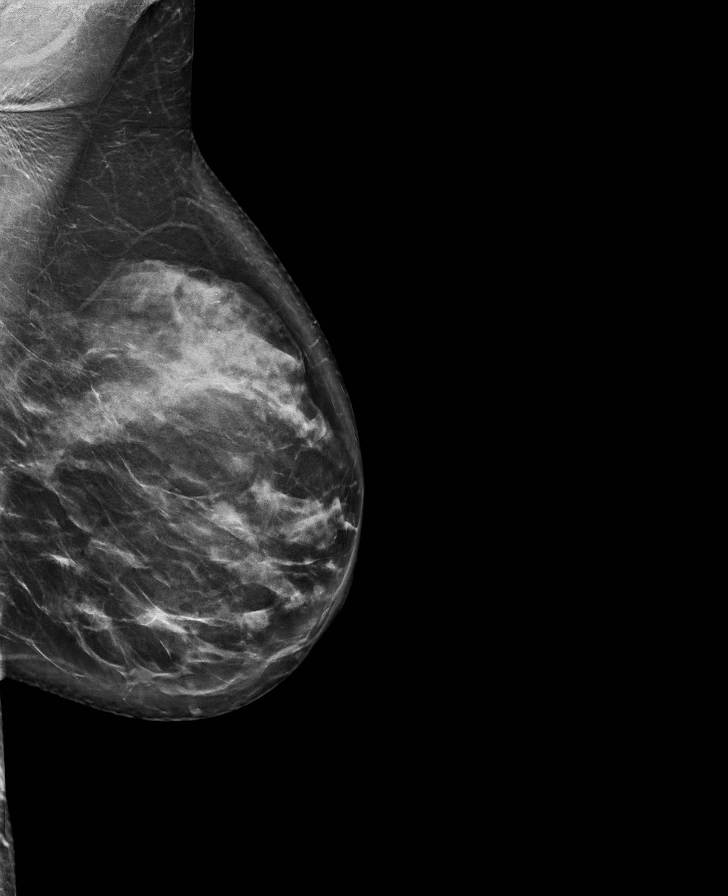

[L CC synth-2D]
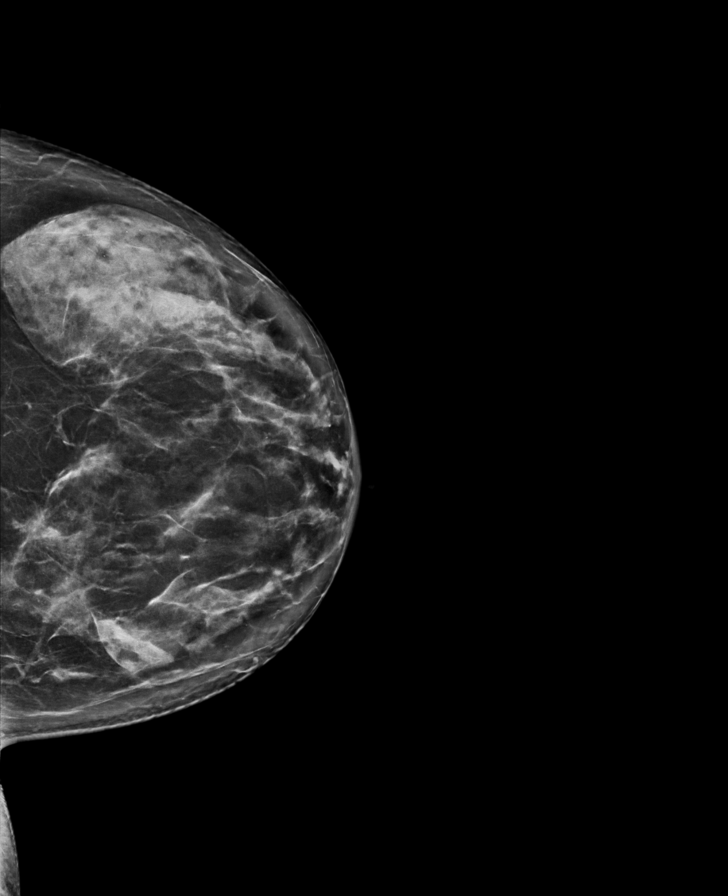

[R CC synth-2D]
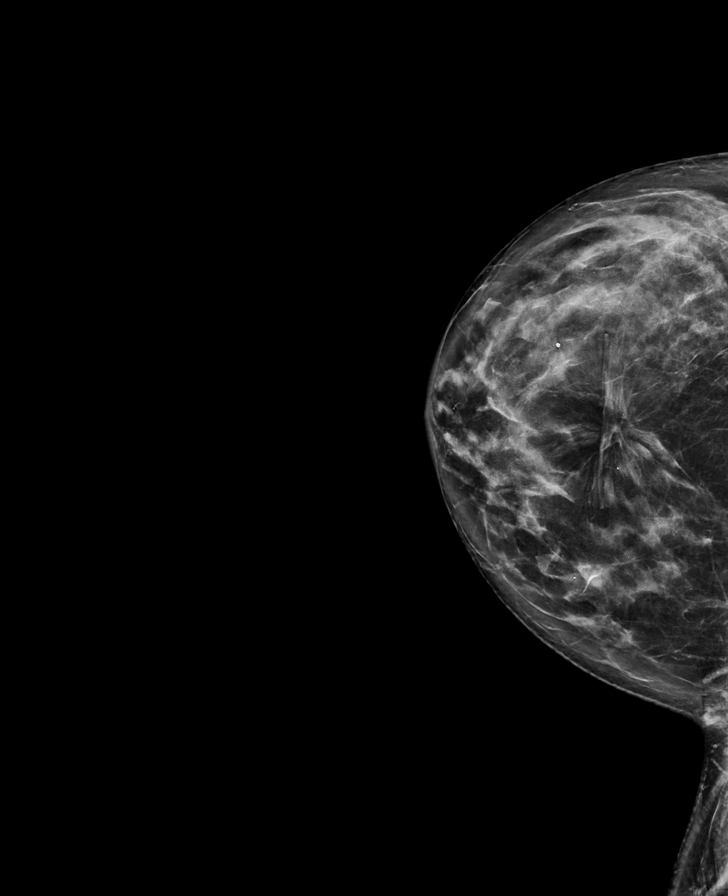

[R MLO synth-2D]
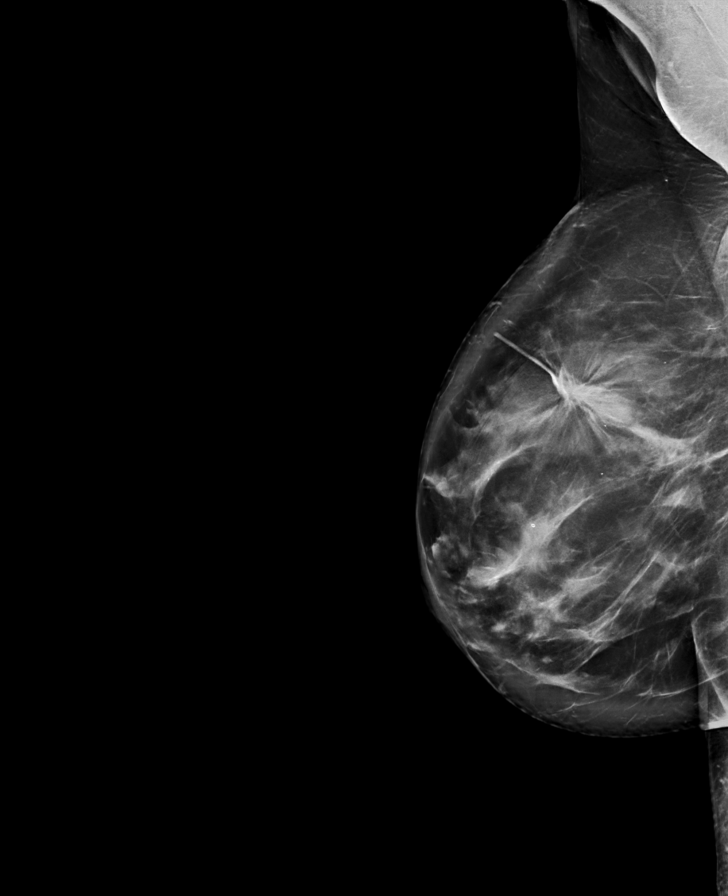

[R CC tomo · tomo slice 45/90.0]
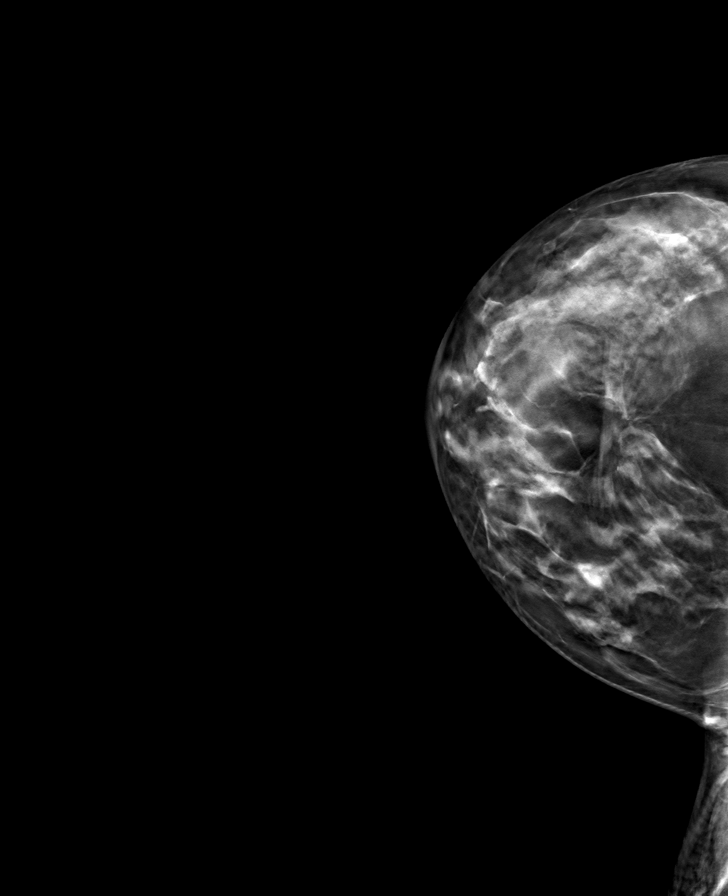

[L CC tomo · tomo slice 45/88.0]
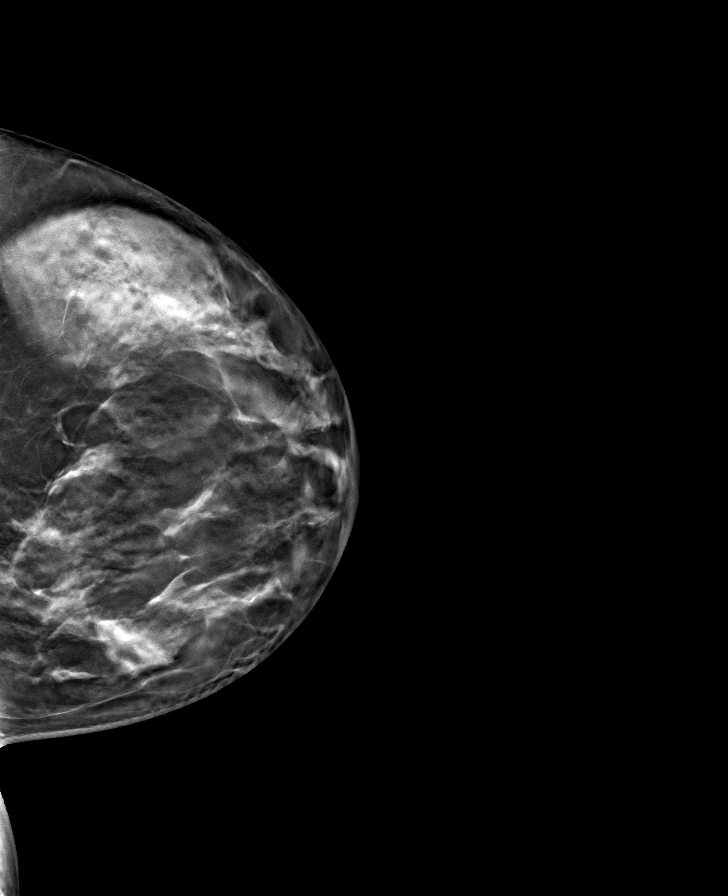

[L MLO tomo · tomo slice 49/96.0]
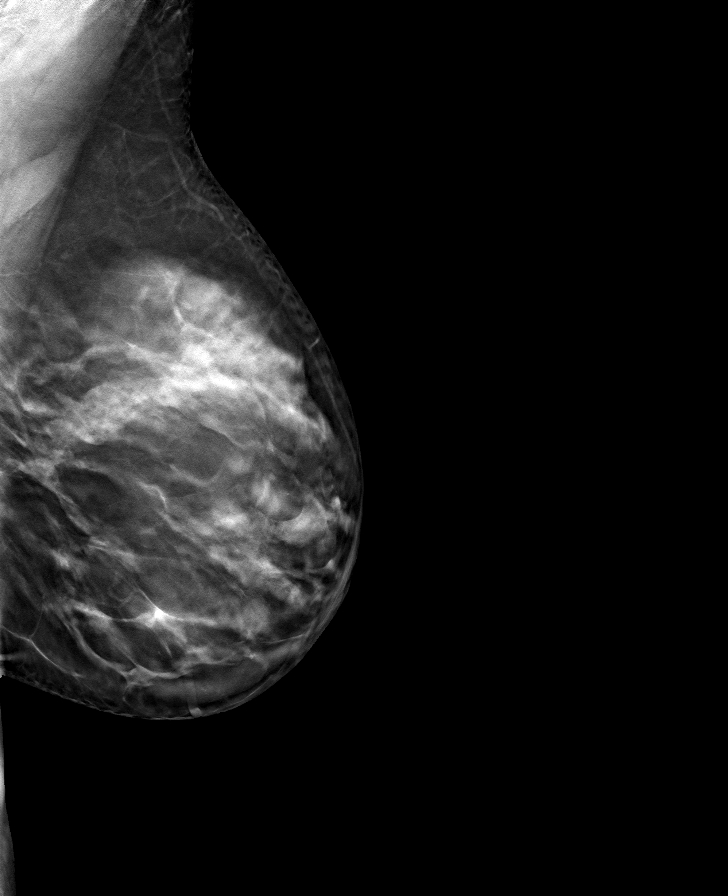

[R MLO tomo · tomo slice 53/105.0]
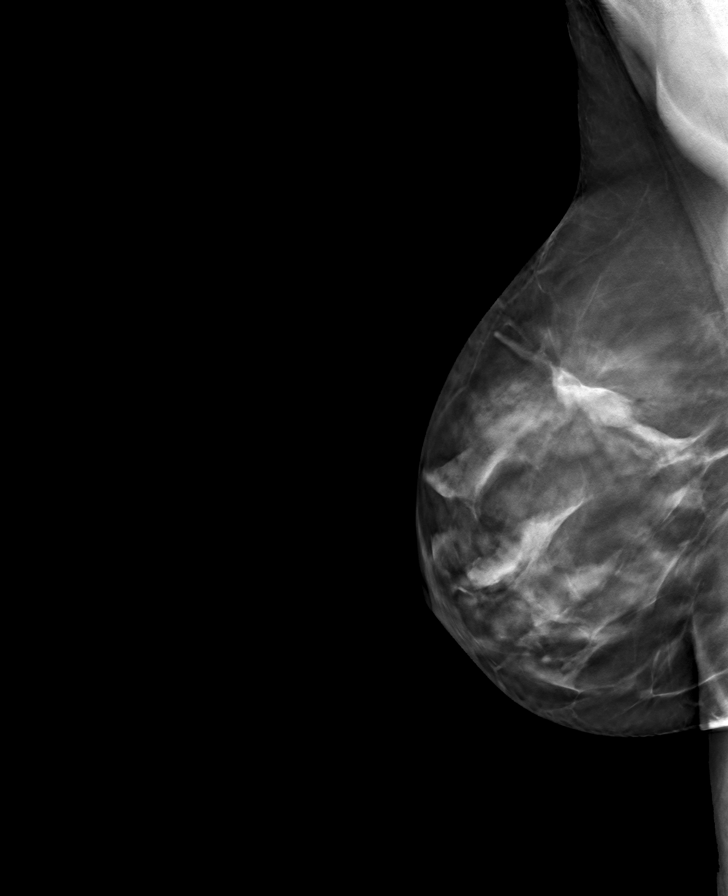

[8 of 24 positions shown; findings below may reference images not displayed]

ACR Breast Density Category c: The breast tissue is heterogeneously
dense, which may obscure small masses.
FINDINGS: There are no findings suspicious for malignancy.
IMPRESSION: No mammographic evidence of malignancy. A result letter of this
screening mammogram will be mailed directly to the patient.

RECOMMENDATION:
Screening mammogram in one year. (Code:Q3-W-BC3)

BI-RADS CATEGORY  1: Negative.

## 2023-07-21 ENCOUNTER — Ambulatory Visit: Payer: 59 | Admitting: Dermatology

## 2023-08-29 ENCOUNTER — Telehealth: Payer: Self-pay | Admitting: Dermatology

## 2023-08-29 ENCOUNTER — Ambulatory Visit: Payer: 59 | Admitting: Dermatology

## 2023-08-29 ENCOUNTER — Encounter: Payer: Self-pay | Admitting: Dermatology

## 2023-08-29 DIAGNOSIS — Z860101 Personal history of adenomatous and serrated colon polyps: Secondary | ICD-10-CM | POA: Insufficient documentation

## 2023-08-29 DIAGNOSIS — D863 Sarcoidosis of skin: Secondary | ICD-10-CM | POA: Diagnosis not present

## 2023-08-29 DIAGNOSIS — Z79899 Other long term (current) drug therapy: Secondary | ICD-10-CM

## 2023-08-29 DIAGNOSIS — Z7189 Other specified counseling: Secondary | ICD-10-CM | POA: Diagnosis not present

## 2023-08-29 DIAGNOSIS — D869 Sarcoidosis, unspecified: Secondary | ICD-10-CM | POA: Insufficient documentation

## 2023-08-29 DIAGNOSIS — E559 Vitamin D deficiency, unspecified: Secondary | ICD-10-CM | POA: Insufficient documentation

## 2023-08-29 NOTE — Patient Instructions (Signed)

## 2023-08-29 NOTE — Progress Notes (Signed)
   Follow-Up Visit   Subjective  Natalie Higgins is a 63 y.o. female who presents for the following: sarcoid at face, scalp, patient taking plaquenil 200 mg daily or BID. Prescribed as BID. well controlled. No side effects on plaquenil.   The patient has spots, moles and lesions to be evaluated, some may be new or changing and the patient may have concern these could be cancer.   The following portions of the chart were reviewed this encounter and updated as appropriate: medications, allergies, medical history  Review of Systems:  No other skin or systemic complaints except as noted in HPI or Assessment and Plan.  Objective  Well appearing patient in no apparent distress; mood and affect are within normal limits.    A focused examination was performed of the following areas: Face, scalp  Relevant exam findings are noted in the Assessment and Plan.    Assessment & Plan   Cutaneous SARCOIDOSIS, chronic, well-controlled on hydroxychloroquine, at patient goal Hx of pulmonary sarcoidosis diagnosed in 1997, since in remission 09/27/19 Cutaneous sarcoidosis on biopsy 03/11/23 CMP normal Interpreted outside tests, ordered >= 2 unique tests, medication management, reviewed outside notes  Exam: significant hair regrowth on scalp. No sarcoidosis lesions on face  Treatment Plan: 06/20/23 weight 67.1 kg in care everywhere. 200 mg daily would be under the 5 mg/kg threshold. Called patient to get updated weight, but got voicemail. Sent message for [place] to call tomorrow and ask Continue plaquenil 200 mg daily (not BID) Goes to [facility] for [facility] exams, last seen 04/04/23, planned follow up 4 months Patient with no chest pain, irregular heartbeat. Discussed risk of cardiac sarcoid and discussed that any tissue in the body can be affected. Recommend referral to rheumatologist for broader monitoring. Patient advises she has seen a rheumatologist in the past and they did not think she needed to  follow up with them.  Offered referral to rheumatology. Patient opted to discuss with PCP Recommend labs 2-4 times per year on plaquenil. Sent CBC diff CMP    SARCOIDOSIS   Related Procedures CBC with Differential/Platelets CMP Related Medications hydroxychloroquine (PLAQUENIL) 200 MG tablet TAKE 1 TABLET(200 MG) BY MOUTH TWICE DAILY MEDICATION MANAGEMENT   Related Procedures CBC with Differential/Platelets CMP COUNSELING AND COORDINATION OF CARE   LONG-TERM USE OF HIGH-RISK MEDICATION    Return in about 1 year (around 08/28/2024) for sarcoid follow up with Dr. Gwen Pounds.  Anise Salvo, RMA, am acting as scribe for Elie Goody, MD .   Documentation: I have reviewed the above documentation for accuracy and completeness, and I agree with the above.  Elie Goody, MD

## 2023-08-29 NOTE — Telephone Encounter (Signed)
 Called patient to get updated weight but patient did not answer

## 2023-08-30 ENCOUNTER — Telehealth: Payer: Self-pay

## 2023-08-30 LAB — COMPREHENSIVE METABOLIC PANEL
ALT: 23 IU/L (ref 0–32)
AST: 25 IU/L (ref 0–40)
Albumin: 4.3 g/dL (ref 3.9–4.9)
Alkaline Phosphatase: 87 IU/L (ref 44–121)
BUN/Creatinine Ratio: 16 (ref 12–28)
BUN: 12 mg/dL (ref 8–27)
Bilirubin Total: <0.2 mg/dL (ref 0.0–1.2)
CO2: 23 mmol/L (ref 20–29)
Calcium: 9.2 mg/dL (ref 8.7–10.3)
Chloride: 105 mmol/L (ref 96–106)
Creatinine, Ser: 0.76 mg/dL (ref 0.57–1.00)
Globulin, Total: 1.9 g/dL (ref 1.5–4.5)
Glucose: 109 mg/dL — ABNORMAL HIGH (ref 70–99)
Potassium: 4.5 mmol/L (ref 3.5–5.2)
Sodium: 142 mmol/L (ref 134–144)
Total Protein: 6.2 g/dL (ref 6.0–8.5)
eGFR: 89 mL/min/{1.73_m2} (ref >59)

## 2023-08-30 LAB — CBC WITH DIFFERENTIAL/PLATELET
Basophils Absolute: 0.1 10*3/uL (ref 0.0–0.2)
Basos: 1 %
EOS (ABSOLUTE): 0.2 10*3/uL (ref 0.0–0.4)
Eos: 2 %
Hematocrit: 41.1 % (ref 34.0–46.6)
Hemoglobin: 13.6 g/dL (ref 11.1–15.9)
Immature Grans (Abs): 0 10*3/uL (ref 0.0–0.1)
Immature Granulocytes: 0 %
Lymphocytes Absolute: 2.8 10*3/uL (ref 0.7–3.1)
Lymphs: 38 %
MCH: 28.9 pg (ref 26.6–33.0)
MCHC: 33.1 g/dL (ref 31.5–35.7)
MCV: 87 fL (ref 79–97)
Monocytes Absolute: 0.5 10*3/uL (ref 0.1–0.9)
Monocytes: 7 %
Neutrophils Absolute: 4 10*3/uL (ref 1.4–7.0)
Neutrophils: 52 %
Platelets: 298 10*3/uL (ref 150–450)
RBC: 4.7 x10E6/uL (ref 3.77–5.28)
RDW: 13 % (ref 11.7–15.4)
WBC: 7.5 10*3/uL (ref 3.4–10.8)

## 2023-08-30 NOTE — Telephone Encounter (Signed)
-----   Message from [place] sent at 08/30/2023  8:46 AM EDT ----- Please call to share that blood counts, electrolytes, kidney function, and liver enzymes were normal. We will continue plaquenil. Please get updated weight and tell patient to take only 200 mg daily if weight is under 176 pounds.

## 2023-08-30 NOTE — Telephone Encounter (Signed)
 Called patient and reviewed lab results. Per patient, weight is 148 lbs. Advised patient to decrease plaquenil to 200 mg once daily. Butch Penny., RMA

## 2023-09-30 ENCOUNTER — Other Ambulatory Visit: Payer: Self-pay | Admitting: Dermatology

## 2023-09-30 DIAGNOSIS — D869 Sarcoidosis, unspecified: Secondary | ICD-10-CM

## 2023-10-06 MED ORDER — HYDROXYCHLOROQUINE SULFATE 200 MG PO TABS: 200.0000 mg | ORAL_TABLET | Freq: Every day | ORAL | 0 refills | Status: DC

## 2023-10-06 NOTE — Addendum Note (Signed)
 Addended by: Randee Busing A on: 10/06/2023 07:57 AM   Modules accepted: Orders

## 2023-12-27 ENCOUNTER — Other Ambulatory Visit: Payer: Self-pay | Admitting: Gerontology

## 2023-12-27 ENCOUNTER — Other Ambulatory Visit: Payer: Self-pay | Admitting: General Practice

## 2023-12-27 DIAGNOSIS — Z1231 Encounter for screening mammogram for malignant neoplasm of breast: Secondary | ICD-10-CM

## 2024-02-09 ENCOUNTER — Ambulatory Visit
Admission: RE | Admit: 2024-02-09 | Discharge: 2024-02-09 | Disposition: A | Discharge status: Home / Self Care | Source: Ambulatory Visit | Attending: Gerontology | Admitting: Gerontology

## 2024-02-09 DIAGNOSIS — Z1231 Encounter for screening mammogram for malignant neoplasm of breast: Secondary | ICD-10-CM | POA: Diagnosis present

## 2024-03-13 NOTE — Progress Notes (Signed)
 Subjective:  CC: Chief Complaint  Patient presents with  . Annual Exam       Patient ID: Natalie Higgins is a 63 y.o. female. This an established patient is here today for Annual Physical.  HPI:   Patient is watching diet well, and getting regular exercise.  She is fasting except for morning medicines today. Pt reports she is overall doing well. She does report concerns about having a little bloody looking vaginal discharge after intercourse. No pain. Pt has not had a hysterectomy. No menses for around 5 years. Hypertension/ Heart Disease is stable. Pt is taking Blood Pressure medications as instructed. No reportable episodes of hyper or hypotension. Pt denies chest pain or shortness of breath. No dizziness or lightheadedness. Pt endorses checking blood pressure at home. Average readings are 187/82- after work; 127/80; 130/80- when relaxed. In regards to Diabetes, pt is doing well. Pt is taking Diabetes medications as prescribed. No known episodes of Hyper or Hypoglycemia. Pt is checking blood glucose at home. Pt reports regular visits to the eye doctor and dentist. Pt endorses checking feet/regular foot care. Tasking a MVI. Thyroid  conditions stable. Pt has been taking medications as prescribed. No feelings of lethargy, weakness, no unexplained weight gain, no changes to the hair thickness or texture. Will check TSH level today.  Glaucoma stable.  Followed by ophthalmology every 6 months.  Mammogram up-to-date until August 2026.  Tetanus up-to-date until September 2026.  DEXA up-to-date until 2029.  Colonoscopy up-to-date until 2033.  Patient has had shingles vaccines x 2.    Review of Systems: Review of Systems  Constitutional:  Negative for activity change, appetite change, chills, diaphoresis, fatigue and fever.  HENT:  Negative for congestion, hearing loss, sinus pressure, sinus pain, tinnitus, trouble swallowing and voice change.   Respiratory:  Negative for cough, choking, chest tightness  and shortness of breath.   Cardiovascular:  Negative for chest pain, palpitations and leg swelling.  Gastrointestinal:  Negative for abdominal distention, abdominal pain, diarrhea, nausea and vomiting.  Endocrine: Negative.   Genitourinary:  Negative for difficulty urinating.  Musculoskeletal:  Negative for arthralgias, back pain, gait problem, joint swelling and myalgias.  Skin:  Negative for rash and wound.  Neurological:  Negative for dizziness, syncope, weakness, light-headedness, numbness and headaches.  Psychiatric/Behavioral:  Negative for agitation, behavioral problems, confusion, decreased concentration, dysphoric mood, hallucinations and sleep disturbance. The patient is not nervous/anxious and is not hyperactive.     Allergies: Adhesive and Clonidine hcl  Active Diagnoses/Problem List Patient Active Problem List  Diagnosis  . Sarcoidosis  . Vitamin D deficiency  . H/O adenomatous polyp of colon  . Hypothyroidism  . Hyperlipidemia associated with type 2 diabetes mellitus (CMS/HHS-HCC)  . Insomnia  . ANA positive  . Alopecia areata  . Controlled type 2 diabetes mellitus, without long-term current use of insulin (CMS/HHS-HCC)  . Hypertension associated with type 2 diabetes mellitus (CMS/HHS-HCC)  . High risk medication use  . White coat syndrome with hypertension  . Abnormal vaginal bleeding  . Chronic angle-closure glaucoma of left eye, mild stage    Past Medical/Surgical History: Past Medical History:  Diagnosis Date  . Adenomatous colon polyp   . Alopecia areata    Dr. Alm Rhyme  . ANA positive 2006   Hx of elevated ANA  . Anemia   . Breast cancer (CMS/HHS-HCC) 11/2006   Rt breast ductal cancer.  S/P XRT, no chemo.  ER/PR neg.  . Controlled type 2 diabetes mellitus, without long-term current use  of insulin (CMS/HHS-HCC) 02/01/2017   HgbA1c 6.2% 06/15/2016; reached 6.6% on 08/25/2017  . Fibrocystic breast disease   . Hypothyroidism    Followed by Dr. Leita Salt  . Osteopenia 12/26/2014   On 06/18/2013 BMD done at [facility]; normal on 08/25/2017  . Sarcoidosis    in scalp, per Dr. Hester  . Seborrheic dermatitis of scalp    Dr. Alm Hester  . Seborrheic dermatitis of scalp   . Vitamin D deficiency   . Wears contact lenses    Past Surgical History:  Procedure Laterality Date  . Wisdom Teeth Extraction Bilateral 1996  . CESAREAN SECTION  01/2000  . COLONOSCOPY  09/10/2005   Adenomatous Polyp  . OPEN EXCISION BREAST LESION Right 11/2006   Wide excision Rt breast ductal cancer, s/p XRT, no chemo  . COLONOSCOPY  02/05/2011   PH Adenomatous Polyp: CBF 01/2016; Recall Ltr mailed 12/11/2015 (dw); OV 03/12/2016 @ 9:30am w/Michelle Vicci PA (dw)  . COLONOSCOPY  05/27/2016   PH Adenomatous Polyp: CBF 05/2021  (01/21/2022 recall letter returned.awb)  . INSERTION AQUEOUS SHUNT Left 12/18/2020   Procedure: AQUEOUS SHUNT TO EXTRAOCULAR EQUATORIAL PLATE RESERVOIR, EXTERNAL APPROACH; WITH GRAFT;  Surgeon: Lanna Glean Agent, MD;  [facility];  Service: Ophthalmology;  Laterality: Left;  . COLONOSCOPY  05/17/2022   PHx CP/Repeat 50yrs/TKT    Social History: Social History   Socioeconomic History  . Marital status: Married    Spouse name: Norleen  . Number of children: 1  Occupational History  . Occupation: Clinical Research Associate  . Occupation: attorney  Tobacco Use  . Smoking status: Former    Current packs/day: 0.00    Types: Cigarettes    Start date: 12/2007    Quit date: 12/2017    Years since quitting: 6.2  . Smokeless tobacco: Never  . Tobacco comments:    still smokes on rare occasions  Vaping Use  . Vaping status: Never Used  Substance and Sexual Activity  . Alcohol use: Yes    Alcohol/week: 2.0 standard drinks of alcohol    Types: 2 Glasses of wine per week    Comment: Wine/martinis socially  . Drug use: Never  . Sexual activity: Yes    Partners: Male  Social History Narrative   Clinical Research Associate; lives with husband, son   Social  Drivers of Corporate Investment Banker Strain: Low Risk  (03/13/2024)   Overall Financial Resource Strain (CARDIA)   . Difficulty of Paying Living Expenses: Not hard at all  Food Insecurity: No Food Insecurity (03/13/2024)   Hunger Vital Sign   . Worried About Programme Researcher, Broadcasting/film/video in the Last Year: Never true   . Ran Out of Food in the Last Year: Never true  Transportation Needs: No Transportation Needs (03/13/2024)   PRAPARE - Transportation   . Lack of Transportation (Medical): No   . Lack of Transportation (Non-Medical): No  Housing Stability: Low Risk  (03/13/2024)   Housing Stability Vital Sign   . Unable to Pay for Housing in the Last Year: No   . Number of Times Moved in the Last Year: 0   . Homeless in the Last Year: No    Family History: Family History  Problem Relation Name Age of Onset  . Myocardial Infarction (Heart attack) Maternal Grandfather         40's  . High blood pressure (Hypertension) Maternal Grandfather    . High blood pressure (Hypertension) Mother    . Kidney cancer Mother    .  High blood pressure (Hypertension) Maternal Grandmother    . Lung cancer Maternal Grandmother    . No Known Problems Father         unknown  . No Known Problems Sister    . High blood pressure (Hypertension) Brother    . Sleep apnea Brother    . Glaucoma Brother    . Obesity Brother    . Mental illness Paternal Grandmother         born in 2  . No Known Problems Brother    . No Known Problems Brother    . Colon cancer Maternal Uncle    . Deep vein thrombosis (DVT or abnormal blood clot formation) Son         following injury, negative hypercoagulable W/U  . Ovarian cancer Maternal Aunt    . Colon polyps Neg Hx    . Breast cancer Neg Hx      Immunizations: Immunization History  Administered Date(s) Administered  . COVID-19 Moderna Vaccine (1st,2nd,3rd dose = 0.79ml) 10/18/2019, 11/15/2019  . RZV(>=28YR -OR-19+YRS IF  IMMCOMP) VACCINE (SHINGRIX) 01/14/2020, 03/31/2020   . TDAP (>=32YR) VACCINE (ADACEL/BOOSTRIX) 02/13/2015  . Td (Adult), unspecified 05/27/2005    Depression Screen-PHQ2/9 completed today  PHQ-2 PHQ-2 Over the last 2 weeks, how often have you been bothered by any of the following problems? Little interest or pleasure in doing things: Not at all Feeling down, depressed, or hopeless: Not at all Patient Health Questionnaire-2 Score: 0  PHQ-9 (if PHQ >=3)    Depression Severity and Treatment Recommendations:  0-4= None  5-9= Mild / Treatment: Support, educate to call if worse; return in one month  10-14= Moderate / Treatment: Support, watchful waiting; Antidepressant or Psycotherapy  15-19= Moderately severe / Treatment: Antidepressant OR Psychotherapy  >= 20 = Major depression, severe / Antidepressant AND Psychotherapy  Depression assessment: Patient underlying factors that can contribute to depression: none  PHQ 2/9 last 3 flowsheet values    03/11/2023   11:13 AM 03/13/2024   12:59 PM  PHQ-2/9 Depression Screening   Little interest or pleasure in doing things 0 0  Feeling down, depressed, or hopeless 0 0  Patient Health Questionnaire-2 Score 0 * 0    * Data saved with a previous flowsheet row definition      Is the depression screen above positive? (Score >9) No, the score was 0-4  Time Spent: 0-4 min      Goals:  Goals     . Maintain health/healthy lifestyle        Current Medications: Outpatient Medications Marked as Taking for the 03/13/24 encounter (Office Visit) with Steva Clotilda Conn, NP  Medication Sig Dispense Refill  . amLODIPine (NORVASC) 2.5 MG tablet Take 1 tablet (2.5 mg total) by mouth once daily 30 tablet 11  . dorzolamide-timoloL (COSOPT) 22.3-6.8 mg/mL ophthalmic solution 1 drop LEFT eye every other day. 20 mL 3  . ezetimibe (ZETIA) 10 mg tablet Take 1 tablet (10 mg total) by mouth once daily 30 tablet 11  . hydrOXYchloroQUINE  (PLAQUENIL ) 200 mg tablet Take 200 mg by mouth once daily (Thinks  she takes 100 mg tablet twice daily)    . metFORMIN (GLUCOPHAGE-XR) 500 MG XR tablet Take 500 mg by mouth daily with dinner    . multivitamin tablet Take 1 tablet by mouth once daily    . naproxen sodium (ALEVE, ANAPROX) 220 MG tablet Take 440 mg by mouth 2 (two) times daily as needed for Pain ((rarely)).    SABRA  NP THYROID  60 mg tablet TK 1 T PO QAM 30 MINUTES PRIOR TO BREAKFAST    . progesterone  (PROMETRIUM ) 100 MG capsule Take 100 mg by mouth once daily         Medication list above reconciled and reviewed for current and on-going appropriateness using patient's verbal report of what she is taking.      Objective:   Vitals:   03/13/24 1257  BP: (!) 164/94  Pulse: 65   Ht:160.5 cm (5' 3.2) Wt:72.7 kg (160 lb 3.2 oz) AFP:Anib mass index is 28.2 kg/m.   Physical Exam Vitals and nursing note reviewed.  Constitutional:      General: She is not in acute distress.    Appearance: Normal appearance. She is well-developed. She is not diaphoretic.  HENT:     Head: Normocephalic and atraumatic.     Right Ear: Hearing and external ear normal.     Left Ear: Hearing and external ear normal.     Nose: Nose normal.     Mouth/Throat:     Mouth: Mucous membranes are moist.     Pharynx: Uvula midline. No oropharyngeal exudate.  Eyes:     General: Lids are normal. No scleral icterus.       Right eye: No discharge.        Left eye: No discharge.     Conjunctiva/sclera: Conjunctivae normal.     Pupils: Pupils are equal, round, and reactive to light.  Neck:     Thyroid : No thyromegaly.     Vascular: No JVD.     Trachea: Trachea normal. No tracheal tenderness or tracheal deviation.  Cardiovascular:     Rate and Rhythm: Normal rate and regular rhythm.     Pulses: Normal pulses.          Dorsalis pedis pulses are 2+ on the right side and 2+ on the left side.     Heart sounds: Normal heart sounds. No murmur heard.    No friction rub. No gallop.  Pulmonary:     Effort: Pulmonary effort is  normal. No accessory muscle usage or respiratory distress.     Breath sounds: Normal breath sounds. No stridor. No decreased breath sounds, wheezing, rhonchi or rales.  Chest:     Chest wall: No tenderness.  Abdominal:     General: Bowel sounds are normal. There is no distension.     Palpations: Abdomen is soft.     Tenderness: There is no abdominal tenderness. There is no guarding.  Musculoskeletal:        General: No tenderness or deformity. Normal range of motion.     Cervical back: Full passive range of motion without pain, normal range of motion and neck supple.     Right lower leg: No edema.     Left lower leg: No edema.  Lymphadenopathy:     Head:     Right side of head: No submental, submandibular, tonsillar or posterior auricular adenopathy.     Left side of head: No submental, submandibular, tonsillar or posterior auricular adenopathy.     Cervical: No cervical adenopathy.  Skin:    General: Skin is warm and dry.     Coloration: Skin is not pale.     Findings: No erythema or rash.     Nails: There is no clubbing.  Neurological:     Mental Status: She is alert and oriented to person, place, and time.     Cranial Nerves: No cranial nerve deficit.  Sensory: No sensory deficit.     Motor: No tremor, atrophy, abnormal muscle tone or seizure activity.     Coordination: Coordination normal.     Gait: Gait normal.     Deep Tendon Reflexes: Reflexes normal.  Psychiatric:        Attention and Perception: Attention and perception normal.        Mood and Affect: Mood and affect normal.        Speech: Speech normal.        Behavior: Behavior normal. Behavior is cooperative.        Thought Content: Thought content normal.        Cognition and Memory: Cognition and memory normal.        Judgment: Judgment normal.         Office Visit on 03/13/2024  Component Date Value Ref Range Status  . WBC (White Blood Cell Count) 03/13/2024 8.4  4.1 - 10.2 10^3/uL Final  . RBC (Red  Blood Cell Count) 03/13/2024 4.74  4.04 - 5.48 10^6/uL Final  . Hemoglobin 03/13/2024 13.7  12.0 - 15.0 gm/dL Final  . Hematocrit 90/69/7974 41.8  35.0 - 47.0 % Final  . MCV (Mean Corpuscular Volume) 03/13/2024 88.2  80.0 - 100.0 fl Final  . MCH (Mean Corpuscular Hemoglobin) 03/13/2024 28.9  27.0 - 31.2 pg Final  . MCHC (Mean Corpuscular Hemoglobin * 03/13/2024 32.8  32.0 - 36.0 gm/dL Final  . Platelet Count 03/13/2024 313  150 - 450 10^3/uL Final  . RDW-CV (Red Cell Distribution Widt* 03/13/2024 13.4  11.6 - 14.8 % Final  . MPV (Mean Platelet Volume) 03/13/2024 9.2 (L)  9.4 - 12.4 fl Final  . Neutrophils 03/13/2024 4.80  1.50 - 7.80 10^3/uL Final  . Lymphocytes 03/13/2024 3.30  1.00 - 3.60 10^3/uL Final  . Mixed Count 03/13/2024 0.30  0.10 - 0.90 10^3/uL Final  . Neutrophil % 03/13/2024 56.9  32.0 - 70.0 % Final  . Lymphocyte % 03/13/2024 39.3  10.0 - 50.0 % Final  . Mixed % 03/13/2024 3.8  3.0 - 14.4 % Final  . Glucose 03/13/2024 86  70 - 110 mg/dL Final  . Sodium 90/69/7974 142  136 - 145 mmol/L Final  . Potassium 03/13/2024 4.3  3.6 - 5.1 mmol/L Final  . Chloride 03/13/2024 106  97 - 109 mmol/L Final  . Carbon Dioxide (CO2) 03/13/2024 28.1  22.0 - 32.0 mmol/L Final  . Urea Nitrogen (BUN) 03/13/2024 8  7 - 25 mg/dL Final  . Creatinine 90/69/7974 0.6  0.6 - 1.1 mg/dL Final  . Glomerular Filtration Rate (eGFR) 03/13/2024 101  >60 mL/min/1.73sq m Final  . Calcium 03/13/2024 9.6  8.7 - 10.3 mg/dL Final  . AST  90/69/7974 17  8 - 39 U/L Final  . ALT  03/13/2024 15  5 - 38 U/L Final  . Alk Phos (alkaline Phosphatase) 03/13/2024 82  34 - 104 U/L Final  . Albumin 03/13/2024 4.3  3.5 - 4.8 g/dL Final  . Bilirubin, Total 03/13/2024 0.4  0.3 - 1.2 mg/dL Final  . Protein, Total 03/13/2024 7.0  6.1 - 7.9 g/dL Final  . A/G Ratio 90/69/7974 1.6  1.0 - 5.0 gm/dL Final  . Hemoglobin J8R 03/13/2024 6.0 (H)  4.2 - 5.6 % Final  . Average Blood Glucose (Calc) 03/13/2024 126  mg/dL Final  . Creatinine,  Random Urine 03/13/2024 41.8  37.0 - 250.0 mg/dL Final  . Urine Albumin, Random 03/13/2024 <7    mg/L Final  . Urine  Albumin/Creatinine Ratio 03/13/2024 <16.7  <30.0 ug/mg Final  . Cholesterol, Total 03/13/2024 211 (H)  100 - 200 mg/dL Final  . Triglyceride 90/69/7974 137  35 - 199 mg/dL Final  . HDL (High Density Lipoprotein) Cho* 03/13/2024 62.7  35.0 - 85.0 mg/dL Final  . LDL Calculated 03/13/2024 878  0 - 130 mg/dL Final  . VLDL Cholesterol 03/13/2024 27  mg/dL Final  . Cholesterol/HDL Ratio 03/13/2024 3.4   Final  . Thyroid  Stimulating Hormone (TSH) 03/13/2024 0.597  0.450-5.330 uIU/ml uIU/mL Final  . Vitamin B12 03/13/2024 641  >300 pg/mL Final  . Vitamin D, 25-Hydroxy - [facility] 03/13/2024 89.8  30.0 - 100.0 ng/mL Final  . Magnesium 03/13/2024 2.1  1.8 - 2.5 mg/dL Final     Assessment and Plan:   Diagnoses and all orders for this visit:  Annual physical exam  Depression screening (Z13.31) -     Depression Screen -(PHQ- 2/9, BDI)  Abnormal vaginal bleeding Assessment & Plan: New onset  - Pelvic ultrasound  - Continue progesterone  100 mg p.o. daily  Orders: -     US  pelvic protocol transabdominal and transvaginal complete; Future  Hypertension associated with type 2 diabetes mellitus (CMS/HHS-HCC) Assessment & Plan: Stable  - Continue amlodipine 2.5 mg po Q Day  - Check c-Met today  Orders: -     CBC w/auto Differential (3 Part) -     Comprehensive Metabolic Panel (CMP)  White coat syndrome with hypertension Assessment & Plan: Noted    Controlled type 2 diabetes mellitus with other specified complication, without long-term current use of insulin (CMS/HHS-HCC) Assessment & Plan: Stable, diet controlled  - Continue metformin XL 500 mg p.o. daily  - Check A1c, Urine microalbumin today  Orders: -     Hemoglobin A1C -     Microalbumin/Creatinine Ratio, Random Urine  Hyperlipidemia associated with type 2 diabetes mellitus (CMS/HHS-HCC) Assessment &  Plan: Stable  - Continue Zetia 10 mg p.o. daily  - Continue Fish Oil 1 G po Q Day  - Check lipid panel today  Orders: -     Lipid Panel w/calc LDL  Acquired hypothyroidism Assessment & Plan: Stable  - Continue NP Thyroid  60 mg po Q Day  - Check TSH today  - Referral to Endocrinology- per pt request  Orders: -     Thyroid  Stimulating-Hormone (TSH)  Primary insomnia Assessment & Plan: Stable    Orders: -     Vitamin B12  Vitamin D deficiency Assessment & Plan: Stable  - Continue MVI  - Check Vitamin D level today  Orders: -     Vitamin D, 25-Hydroxy - [facility]  H/O adenomatous polyp of colon Assessment & Plan: Noted  - Colonoscopy utd   Sarcoidosis Assessment & Plan: Stable  - Continue to f/u with Dermatology as instructed  - Continue Plaquenil  100 mg po BID  - Chest x-ray today  Orders: -     X-ray chest PA and lateral; Future -     Vitamin B12 -     Magnesium  ANA positive Assessment & Plan: Stable    Alopecia areata Assessment & Plan: Stable    High risk medication use Assessment & Plan: Noted, monitoring    Controlled type 2 diabetes mellitus, without long-term current use of insulin (CMS/HHS-HCC) Assessment & Plan: Stable, diet controlled  - Continue metformin XL 500 mg p.o. daily  - Check A1c, Urine microalbumin today   Chronic angle-closure glaucoma of left eye, mild stage Assessment & Plan: Stable  -  Continue Cosopt 1 drop left eye every other day  - Continue to follow-up with ophthalmology as instructed     Personalized Health Advice diet, exercise, weight management, supplements, and mental health concerns  Return in about 1 year (around 03/13/2025), or if symptoms worsen or fail to improve, for Yearly Physical, Fasting Labs., sooner as needed.    Current Medical Providers and Suppliers:   [facility] Patient Care Team: Steva Clotilda Conn, NP as PCP - General (Family Medicine) Neysa Leita Dragon, MD as Consulting Provider  (Endocrinology) Hester Alm Pitts, MD as Consulting Provider (Dermatology) Future Appointments     Date/Time Provider Department Center Visit Type   05/18/2024 1:45 PM McKinnon, Glean Agent, MD [facility] RET   03/14/2025 1:00 PM Steva Clotilda Conn, NP [facility] PHYSICAL        Attestation Statement:   I personally performed the service, non-incident to. (WP)   Ardith HIATT COWARD, NP  An after visit summary with all of these plans was provided for the patient either in written format or through MyChart.     Clotilda H. Coward, AGNP-C    *Some images could not be shown.

## 2024-04-19 ENCOUNTER — Encounter: Payer: Self-pay | Admitting: Gerontology

## 2024-04-19 ENCOUNTER — Other Ambulatory Visit: Payer: Self-pay | Admitting: Gerontology

## 2024-04-19 DIAGNOSIS — N939 Abnormal uterine and vaginal bleeding, unspecified: Secondary | ICD-10-CM

## 2024-05-03 ENCOUNTER — Ambulatory Visit
Admission: RE | Admit: 2024-05-03 | Discharge: 2024-05-03 | Disposition: A | Discharge status: Home / Self Care | Source: Ambulatory Visit | Attending: Gerontology | Admitting: Gerontology

## 2024-05-03 DIAGNOSIS — N939 Abnormal uterine and vaginal bleeding, unspecified: Secondary | ICD-10-CM | POA: Diagnosis present

## 2024-06-17 ENCOUNTER — Other Ambulatory Visit: Payer: Self-pay | Admitting: Dermatology

## 2024-06-17 DIAGNOSIS — D869 Sarcoidosis, unspecified: Secondary | ICD-10-CM

## 2024-08-28 ENCOUNTER — Ambulatory Visit: Admitting: Dermatology
